# Patient Record
Sex: Female | Born: 1955 | Race: Black or African American | Hispanic: No | Marital: Single | Smoking: Current every day smoker
Health system: Southern US, Community
[De-identification: ages and names within clinical notes are randomized; demographics above are authoritative.]

## PROBLEM LIST (undated history)

## (undated) DIAGNOSIS — F419 Anxiety disorder, unspecified: Secondary | ICD-10-CM

## (undated) DIAGNOSIS — R7611 Nonspecific reaction to tuberculin skin test without active tuberculosis: Secondary | ICD-10-CM

## (undated) DIAGNOSIS — E785 Hyperlipidemia, unspecified: Secondary | ICD-10-CM

## (undated) DIAGNOSIS — I10 Essential (primary) hypertension: Secondary | ICD-10-CM

## (undated) DIAGNOSIS — F32A Depression, unspecified: Secondary | ICD-10-CM

## (undated) DIAGNOSIS — F329 Major depressive disorder, single episode, unspecified: Secondary | ICD-10-CM

## (undated) DIAGNOSIS — E119 Type 2 diabetes mellitus without complications: Secondary | ICD-10-CM

## (undated) HISTORY — DX: Type 2 diabetes mellitus without complications: E11.9

## (undated) HISTORY — DX: Hyperlipidemia, unspecified: E78.5

## (undated) HISTORY — DX: Anxiety disorder, unspecified: F41.9

## (undated) HISTORY — DX: Major depressive disorder, single episode, unspecified: F32.9

## (undated) HISTORY — DX: Essential (primary) hypertension: I10

## (undated) HISTORY — DX: Nonspecific reaction to tuberculin skin test without active tuberculosis: R76.11

## (undated) HISTORY — PX: COLONOSCOPY: SHX174

## (undated) HISTORY — DX: Depression, unspecified: F32.A

---

## 1982-01-14 HISTORY — PX: TUBAL LIGATION: SHX77

## 1999-04-20 ENCOUNTER — Other Ambulatory Visit: Admission: RE | Admit: 1999-04-20 | Discharge: 1999-04-20 | Payer: Self-pay | Admitting: Family Medicine

## 2001-06-29 ENCOUNTER — Ambulatory Visit (HOSPITAL_COMMUNITY): Admission: RE | Admit: 2001-06-29 | Discharge: 2001-06-29 | Payer: Self-pay | Admitting: Family Medicine

## 2001-06-29 ENCOUNTER — Encounter: Payer: Self-pay | Admitting: Family Medicine

## 2003-07-08 ENCOUNTER — Other Ambulatory Visit: Admission: RE | Admit: 2003-07-08 | Discharge: 2003-07-08 | Payer: Self-pay | Admitting: Family Medicine

## 2004-01-15 DIAGNOSIS — I10 Essential (primary) hypertension: Secondary | ICD-10-CM

## 2004-01-15 HISTORY — DX: Essential (primary) hypertension: I10

## 2004-05-14 ENCOUNTER — Ambulatory Visit (HOSPITAL_COMMUNITY): Admission: RE | Admit: 2004-05-14 | Discharge: 2004-05-14 | Payer: Self-pay | Admitting: Family Medicine

## 2005-05-09 ENCOUNTER — Other Ambulatory Visit: Admission: RE | Admit: 2005-05-09 | Discharge: 2005-05-09 | Payer: Self-pay | Admitting: Family Medicine

## 2005-11-05 ENCOUNTER — Ambulatory Visit (HOSPITAL_COMMUNITY): Admission: RE | Admit: 2005-11-05 | Discharge: 2005-11-05 | Payer: Self-pay | Admitting: Family Medicine

## 2006-05-14 ENCOUNTER — Other Ambulatory Visit: Admission: RE | Admit: 2006-05-14 | Discharge: 2006-05-14 | Payer: Self-pay | Admitting: Family Medicine

## 2007-07-02 ENCOUNTER — Other Ambulatory Visit: Admission: RE | Admit: 2007-07-02 | Discharge: 2007-07-02 | Payer: Self-pay | Admitting: Family Medicine

## 2007-08-19 ENCOUNTER — Encounter: Admission: RE | Admit: 2007-08-19 | Discharge: 2007-08-19 | Payer: Self-pay | Admitting: Family Medicine

## 2007-11-15 ENCOUNTER — Emergency Department (HOSPITAL_COMMUNITY): Admission: EM | Admit: 2007-11-15 | Discharge: 2007-11-15 | Payer: Self-pay | Admitting: Emergency Medicine

## 2008-01-15 HISTORY — PX: KNEE SURGERY: SHX244

## 2009-05-19 ENCOUNTER — Encounter: Admission: RE | Admit: 2009-05-19 | Discharge: 2009-05-19 | Payer: Self-pay | Admitting: Family Medicine

## 2010-01-14 DIAGNOSIS — E785 Hyperlipidemia, unspecified: Secondary | ICD-10-CM

## 2010-01-14 HISTORY — DX: Hyperlipidemia, unspecified: E78.5

## 2010-02-04 ENCOUNTER — Encounter: Payer: Self-pay | Admitting: Family Medicine

## 2010-02-16 ENCOUNTER — Emergency Department (HOSPITAL_COMMUNITY)
Admission: EM | Admit: 2010-02-16 | Discharge: 2010-02-16 | Disposition: A | Discharge status: Home / Self Care | Payer: Self-pay | Attending: Emergency Medicine | Admitting: Emergency Medicine

## 2010-02-16 DIAGNOSIS — R5381 Other malaise: Secondary | ICD-10-CM | POA: Insufficient documentation

## 2010-02-16 DIAGNOSIS — R5383 Other fatigue: Secondary | ICD-10-CM | POA: Insufficient documentation

## 2010-02-16 DIAGNOSIS — I10 Essential (primary) hypertension: Secondary | ICD-10-CM | POA: Insufficient documentation

## 2010-02-16 DIAGNOSIS — R002 Palpitations: Secondary | ICD-10-CM | POA: Insufficient documentation

## 2010-02-16 DIAGNOSIS — Z79899 Other long term (current) drug therapy: Secondary | ICD-10-CM | POA: Insufficient documentation

## 2010-02-16 DIAGNOSIS — R209 Unspecified disturbances of skin sensation: Secondary | ICD-10-CM | POA: Insufficient documentation

## 2010-02-16 DIAGNOSIS — R42 Dizziness and giddiness: Secondary | ICD-10-CM | POA: Insufficient documentation

## 2010-02-16 DIAGNOSIS — F411 Generalized anxiety disorder: Secondary | ICD-10-CM | POA: Insufficient documentation

## 2010-02-16 LAB — POCT I-STAT, CHEM 8
BUN: 15 mg/dL (ref 6–23)
Creatinine, Ser: 1 mg/dL (ref 0.4–1.2)
Hemoglobin: 14.3 g/dL (ref 12.0–15.0)
Potassium: 3.8 mEq/L (ref 3.5–5.1)
Sodium: 143 mEq/L (ref 135–145)
TCO2: 27 mmol/L (ref 0–100)

## 2011-01-14 IMAGING — CR DG CHEST 2V
2 series · 2 of 2 positions shown · non-contrast
Comparison: None.

CLINICAL DATA: Cough, positive PPD

CHEST - 2 VIEW

[view not recorded (1 of 2)]
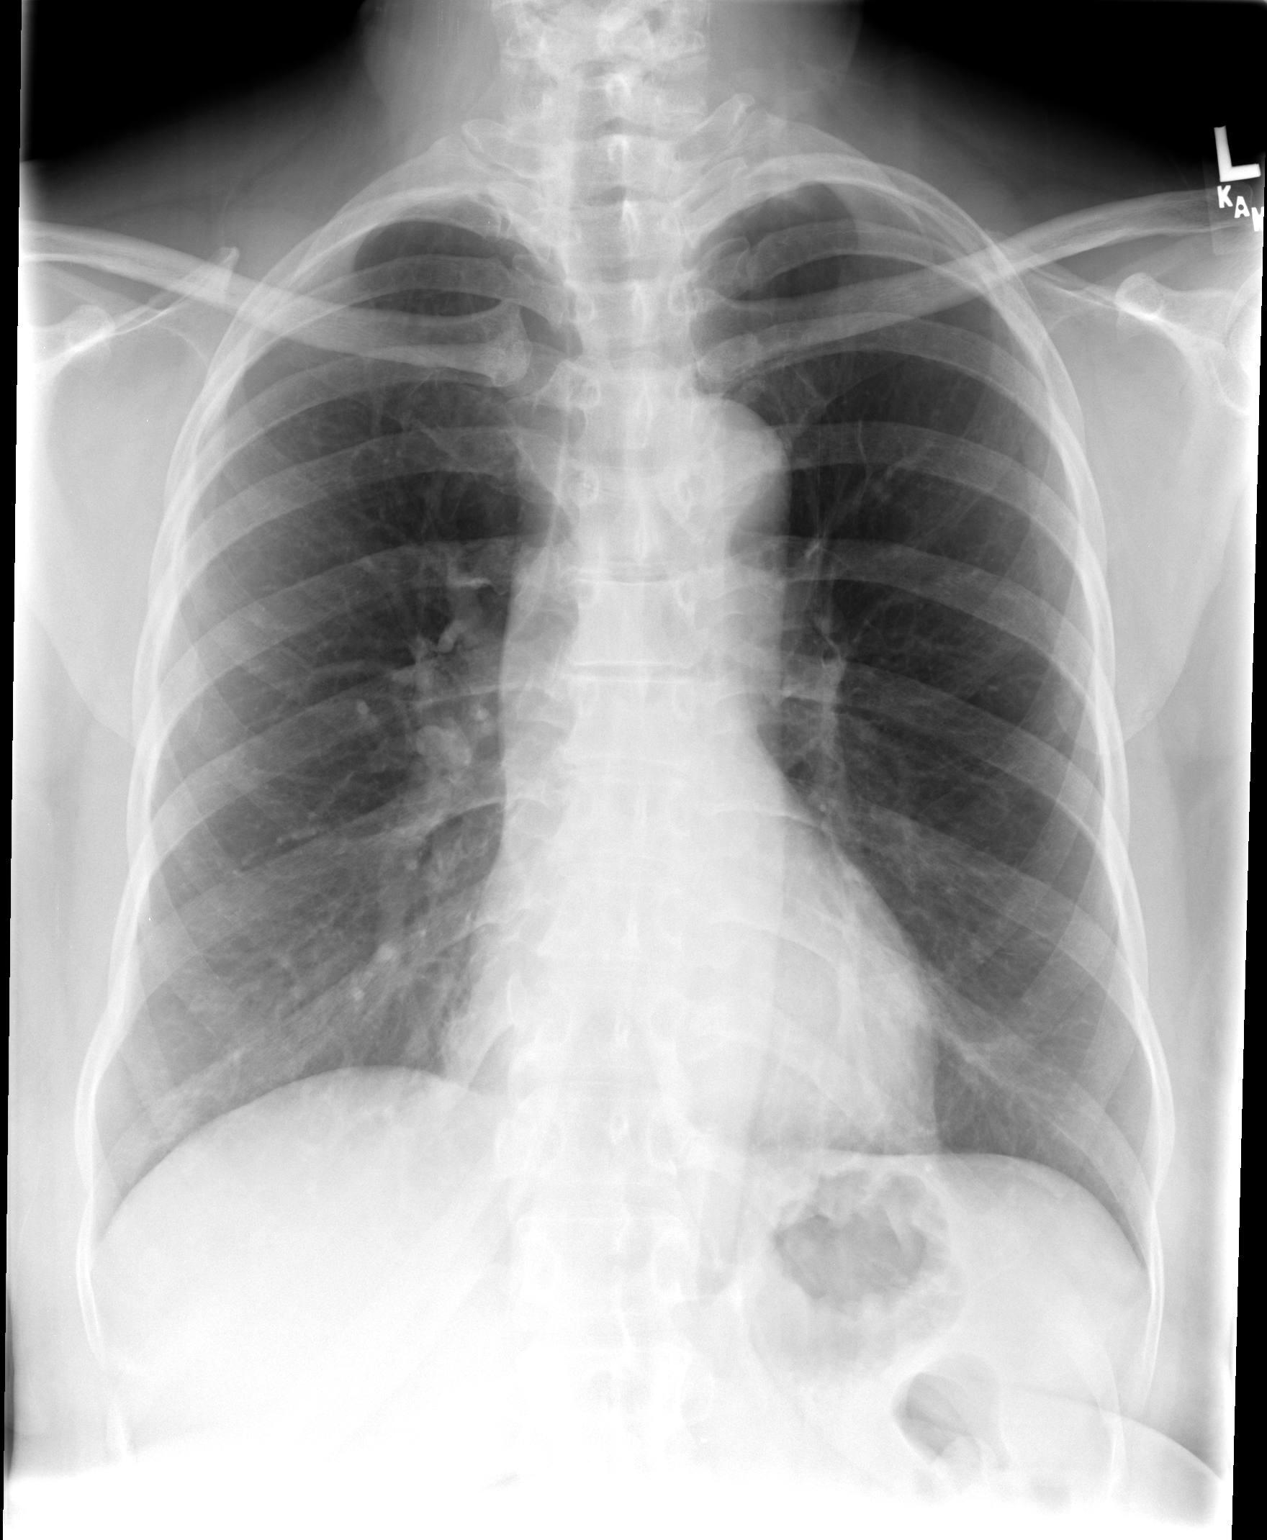

[view not recorded (2 of 2)]
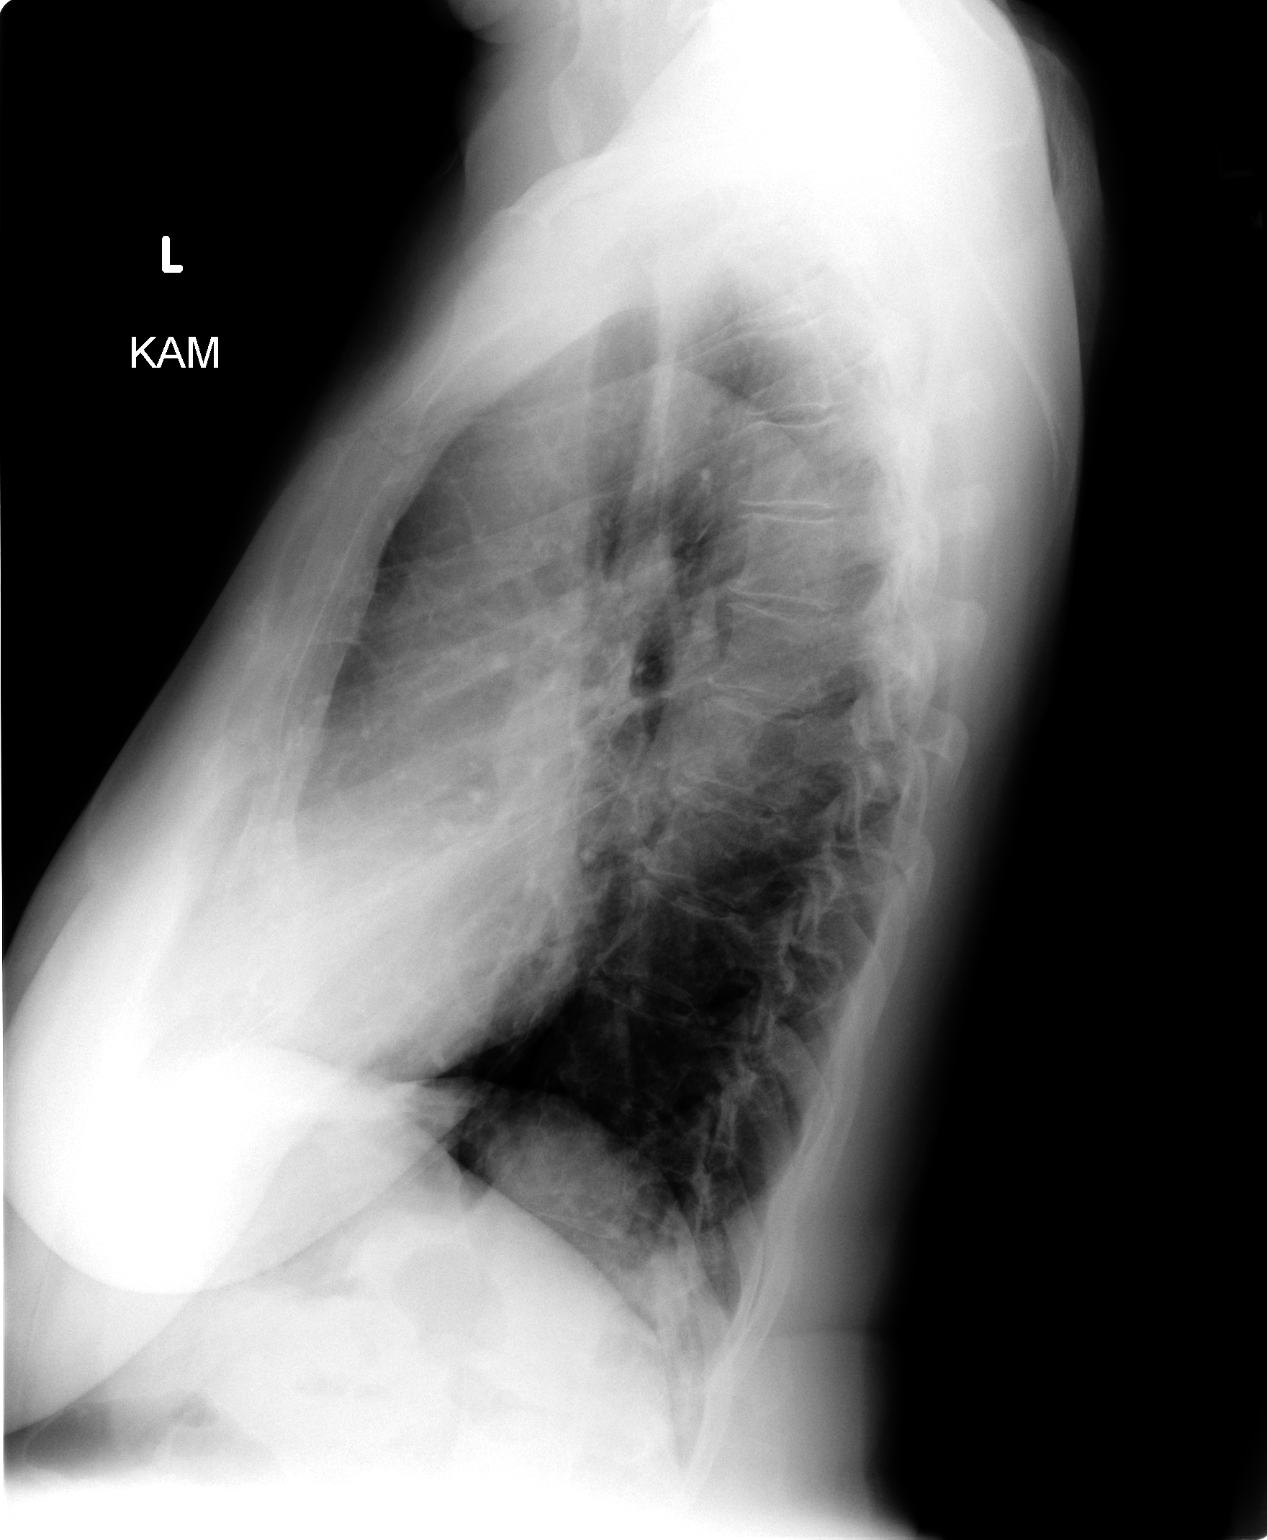

[2 of 2 positions shown; findings below may reference images not displayed]

FINDINGS: Lungs clear.  Heart size and pulmonary vascularity
normal.  No effusion.  Visualized bones unremarkable.
IMPRESSION: No acute disease

## 2015-01-13 ENCOUNTER — Encounter: Payer: Self-pay | Admitting: Family Medicine

## 2015-01-13 ENCOUNTER — Ambulatory Visit: Payer: Self-pay | Attending: Family Medicine | Admitting: Family Medicine

## 2015-01-13 VITALS — BP 131/81 | HR 85 | Temp 99.1°F | Resp 18 | Ht 65.0 in | Wt 185.0 lb

## 2015-01-13 DIAGNOSIS — I10 Essential (primary) hypertension: Secondary | ICD-10-CM | POA: Insufficient documentation

## 2015-01-13 DIAGNOSIS — Z833 Family history of diabetes mellitus: Secondary | ICD-10-CM | POA: Insufficient documentation

## 2015-01-13 DIAGNOSIS — E785 Hyperlipidemia, unspecified: Secondary | ICD-10-CM | POA: Insufficient documentation

## 2015-01-13 DIAGNOSIS — R2230 Localized swelling, mass and lump, unspecified upper limb: Secondary | ICD-10-CM | POA: Insufficient documentation

## 2015-01-13 DIAGNOSIS — Z114 Encounter for screening for human immunodeficiency virus [HIV]: Secondary | ICD-10-CM | POA: Insufficient documentation

## 2015-01-13 DIAGNOSIS — Z0000 Encounter for general adult medical examination without abnormal findings: Secondary | ICD-10-CM | POA: Insufficient documentation

## 2015-01-13 DIAGNOSIS — E559 Vitamin D deficiency, unspecified: Secondary | ICD-10-CM | POA: Insufficient documentation

## 2015-01-13 DIAGNOSIS — R2232 Localized swelling, mass and lump, left upper limb: Secondary | ICD-10-CM | POA: Insufficient documentation

## 2015-01-13 DIAGNOSIS — E119 Type 2 diabetes mellitus without complications: Secondary | ICD-10-CM | POA: Insufficient documentation

## 2015-01-13 DIAGNOSIS — D171 Benign lipomatous neoplasm of skin and subcutaneous tissue of trunk: Secondary | ICD-10-CM | POA: Insufficient documentation

## 2015-01-13 DIAGNOSIS — Z23 Encounter for immunization: Secondary | ICD-10-CM | POA: Insufficient documentation

## 2015-01-13 DIAGNOSIS — Z1159 Encounter for screening for other viral diseases: Secondary | ICD-10-CM | POA: Insufficient documentation

## 2015-01-13 DIAGNOSIS — E1169 Type 2 diabetes mellitus with other specified complication: Secondary | ICD-10-CM | POA: Insufficient documentation

## 2015-01-13 DIAGNOSIS — F17200 Nicotine dependence, unspecified, uncomplicated: Secondary | ICD-10-CM | POA: Insufficient documentation

## 2015-01-13 LAB — COMPLETE METABOLIC PANEL WITH GFR
ALT: 25 U/L (ref 6–29)
AST: 18 U/L (ref 10–35)
Albumin: 4.3 g/dL (ref 3.6–5.1)
Alkaline Phosphatase: 93 U/L (ref 33–130)
BUN: 10 mg/dL (ref 7–25)
CALCIUM: 9.5 mg/dL (ref 8.6–10.4)
CHLORIDE: 105 mmol/L (ref 98–110)
CO2: 27 mmol/L (ref 20–31)
CREATININE: 0.88 mg/dL (ref 0.50–1.05)
GFR, Est African American: 84 mL/min (ref >=60)
GFR, Est Non African American: 73 mL/min (ref >=60)
GLUCOSE: 100 mg/dL — AB (ref 65–99)
Potassium: 4.3 mmol/L (ref 3.5–5.3)
SODIUM: 141 mmol/L (ref 135–146)
Total Bilirubin: 0.4 mg/dL (ref 0.2–1.2)
Total Protein: 7.6 g/dL (ref 6.1–8.1)

## 2015-01-13 LAB — LIPID PANEL
Cholesterol: 171 mg/dL (ref 125–200)
HDL: 37 mg/dL — ABNORMAL LOW (ref >=46)
LDL CALC: 95 mg/dL (ref <130)
Total CHOL/HDL Ratio: 4.6 Ratio (ref <=5.0)
Triglycerides: 196 mg/dL — ABNORMAL HIGH (ref <150)
VLDL: 39 mg/dL — AB (ref <30)

## 2015-01-13 LAB — GLUCOSE, POCT (MANUAL RESULT ENTRY): POC Glucose: 119 mg/dl — AB (ref 70–99)

## 2015-01-13 LAB — POCT GLYCOSYLATED HEMOGLOBIN (HGB A1C): HEMOGLOBIN A1C: 7.1

## 2015-01-13 MED ORDER — GLUCOSE BLOOD VI STRP: 1.0000 | ORAL_STRIP | Freq: Three times a day (TID) | Status: DC

## 2015-01-13 MED ORDER — ATORVASTATIN CALCIUM 10 MG PO TABS: 10.0000 mg | ORAL_TABLET | Freq: Every day | ORAL | Status: DC

## 2015-01-13 MED ORDER — TRUE METRIX METER W/DEVICE KIT: 1.0000 | PACK | Status: DC | PRN

## 2015-01-13 MED ORDER — TRUEPLUS LANCETS 28G MISC: 1.0000 | Freq: Three times a day (TID) | Status: DC

## 2015-01-13 MED ORDER — VITAMIN D 50 MCG (2000 UT) PO TABS: 2000.0000 [IU] | ORAL_TABLET | Freq: Every day | ORAL | Status: DC

## 2015-01-13 MED ORDER — LISINOPRIL-HYDROCHLOROTHIAZIDE 20-12.5 MG PO TABS: 1.0000 | ORAL_TABLET | Freq: Every day | ORAL | Status: DC

## 2015-01-13 NOTE — Assessment & Plan Note (Signed)
Painless, chronic  Patient uninsured Reassurance

## 2015-01-13 NOTE — Progress Notes (Addendum)
Subjective:  Patient ID: Tammy Beck, female    DOB: [birth year]  Age: 59 y.o. MRN: [id number]  CC: Establish Care   HPI Tammy Beck presents for    1. HTN: has chronic HTN. No CP, HA, SOB or swelling. On prinzide.   2. HLD: taking lipitor. No myalgias.   3. Diabetes: diagnosed today. Patient with strong fam hx of diabetes and suspected diabetes due to tingling in  R arms and L finger tips. No vision changes. No polyuria or polydipsia. No tingling or numbness in feet.    4. L axilla mass: x one month. Non tender. No nipple discharge. She has hx of L upper back lipoma for manye years. Non tender.   Past Medical History  Diagnosis Date  . Diabetes mellitus without complication ([place]) 78/29/5621  . Hypertension 2006   . Hyperlipidemia 2012    Past Surgical History  Procedure Laterality Date  . Knee surgery Right 2010    torn meniscus   . Tubal ligation  1984    Family History  Problem Relation Age of Onset  . Diabetes Brother   . Colon cancer Brother 59  . Kidney disease Brother   . Diabetes Mother   . Kidney disease Mother   . Hypertension Mother   . Kidney disease Son     Social History  Substance Use Topics  . Smoking status: Current Every Day Smoker  . Smokeless tobacco: Never Used  . Alcohol Use: 0.0 oz/week    0 Standard drinks or equivalent per week     Comment: occ     ROS Review of Systems  Constitutional: Negative for fever and chills.  Eyes: Negative for visual disturbance.  Respiratory: Negative for shortness of breath.   Cardiovascular: Negative for chest pain.  Gastrointestinal: Negative for abdominal pain and blood in stool.  Musculoskeletal: Negative for back pain and arthralgias.  Skin: Negative for rash.  Allergic/Immunologic: Negative for immunocompromised state.  Neurological: Positive for numbness (R hand and arm intetmittent with tingling ).  Hematological: Negative for adenopathy. Does not bruise/bleed easily.    Psychiatric/Behavioral: Negative for suicidal ideas and dysphoric mood.    Objective:   Today's Vitals: BP 131/81 mmHg  Pulse 85  Temp(Src) 99.1 F (37.3 C) (Oral)  Resp 18  Ht '5\' 5"'  (1.651 m)  Wt 185 lb (83.915 kg)  BMI 30.79 kg/m2  SpO2 98%  Physical Exam  Constitutional: She is oriented to person, place, and time. She appears well-developed and well-nourished. No distress.  HENT:  Head: Normocephalic and atraumatic.  Cardiovascular: Normal rate, regular rhythm, normal heart sounds and intact distal pulses.   Pulmonary/Chest: Effort normal and breath sounds normal. Right breast exhibits no inverted nipple, no mass, no nipple discharge, no skin change and no tenderness. Left breast exhibits mass. Left breast exhibits no inverted nipple, no nipple discharge, no skin change and no tenderness. Breasts are symmetrical.    Musculoskeletal: She exhibits no edema.       Arms: Neurological: She is alert and oriented to person, place, and time.  Skin: Skin is warm and dry. No rash noted.  Psychiatric: She has a normal mood and affect.   Lab Results  Component Value Date   HGBA1C 7.10 01/13/2015   CBG 118   Depression screen PHQ 2/9 01/13/2015  Decreased Interest 0  Down, Depressed, Hopeless 0  PHQ - 2 Score 0     Assessment & Plan:  Kerie was seen today for establish care.  Diagnoses and  all orders for this visit:  Essential hypertension -     COMPLETE METABOLIC PANEL WITH GFR -     lisinopril-hydrochlorothiazide (PRINZIDE,ZESTORETIC) 20-12.5 MG tablet; Take 1 tablet by mouth daily.  Hyperlipidemia -     Lipid Panel -     atorvastatin (LIPITOR) 10 MG tablet; Take 1 tablet (10 mg total) by mouth daily.  Vitamin D deficiency -     Vitamin D, 25-hydroxy -     Cholecalciferol (VITAMIN D) 2000 units tablet; Take 1 tablet (2,000 Units total) by mouth daily.  Family history of diabetes mellitus (DM) -     POCT A1C -     Glucose (CBG)  Controlled type 2 diabetes  mellitus without complication, without long-term current use of insulin (HCC) -     COMPLETE METABOLIC PANEL WITH GFR -     Microalbumin/Creatinine Ratio, Urine -     Blood Glucose Monitoring Suppl (TRUE METRIX METER) w/Device KIT; 1 each by Does not apply route as needed. -     TRUEPLUS LANCETS 28G MISC; 1 each by Does not apply route 3 (three) times daily. -     glucose blood (TRUE METRIX BLOOD GLUCOSE TEST) test strip; 1 each by Other route 3 (three) times daily.  Lipoma of back  Mass of axilla, left -     MM Digital Diagnostic Bilat; Future -     US BREAST COMPLETE UNI LEFT INC AXILLA; Future  Healthcare maintenance -     Flu Vaccine QUAD 36+ mos PF IM (Fluarix & Fluzone Quad PF)  Need for hepatitis C screening test -     Hepatitis C antibody, reflex  Screening for HIV (human immunodeficiency virus) -     HIV antibody (with reflex)    Outpatient Encounter Prescriptions as of 01/13/2015  Medication Sig  . atorvastatin (LIPITOR) 10 MG tablet Take 1 tablet (10 mg total) by mouth daily.  . Cholecalciferol (VITAMIN D) 2000 units tablet Take 1 tablet (2,000 Units total) by mouth daily.  Marland Kitchen lisinopril-hydrochlorothiazide (PRINZIDE,ZESTORETIC) 20-12.5 MG tablet Take 1 tablet by mouth daily.  . [DISCONTINUED] atorvastatin (LIPITOR) 10 MG tablet Take 10 mg by mouth daily.  . [DISCONTINUED] Cholecalciferol (VITAMIN D) 2000 units tablet Take 2,000 Units by mouth daily.  . [DISCONTINUED] lisinopril-hydrochlorothiazide (PRINZIDE,ZESTORETIC) 20-12.5 MG tablet Take 1 tablet by mouth daily.  . Blood Glucose Monitoring Suppl (TRUE METRIX METER) w/Device KIT 1 each by Does not apply route as needed.  Marland Kitchen glucose blood (TRUE METRIX BLOOD GLUCOSE TEST) test strip 1 each by Other route 3 (three) times daily.  . TRUEPLUS LANCETS 28G MISC 1 each by Does not apply route 3 (three) times daily.   No facility-administered encounter medications on file as of 01/13/2015.    Follow-up: No Follow-up on file.     Boykin Nearing MD

## 2015-01-13 NOTE — Assessment & Plan Note (Signed)
A: controlled P; CMP Refilled prinzide

## 2015-01-13 NOTE — Assessment & Plan Note (Signed)
A: suspect lipoma, new mass P: Diagnostic mammogram and [place] ordered Patient to meet with financial for orange card/[place] discount

## 2015-01-13 NOTE — Progress Notes (Signed)
Patient here to Establish Care.  Patient complains of tingling in her right hand and arm. Patient states feeling has been present "for awhile"  Patient also would like fatty tissue assessed on her left back shoulder/arm pit.  Patient tolerated flu shot well.

## 2015-01-13 NOTE — Assessment & Plan Note (Signed)
A: new diagnosis. Strong fam hx P: Low carb diet Meter for testing  [facility] per orders

## 2015-01-13 NOTE — Assessment & Plan Note (Signed)
Checking lipids

## 2015-01-13 NOTE — Patient Instructions (Addendum)
Tammy Beck was seen today for establish care.  Diagnoses and all orders for this visit:  Essential hypertension -     COMPLETE METABOLIC PANEL WITH GFR -     lisinopril-hydrochlorothiazide (PRINZIDE,ZESTORETIC) 20-12.5 MG tablet; Take 1 tablet by mouth daily.  Hyperlipidemia -     Lipid Panel -     atorvastatin (LIPITOR) 10 MG tablet; Take 1 tablet (10 mg total) by mouth daily.  Vitamin D deficiency -     Vitamin D, 25-hydroxy -     Cholecalciferol (VITAMIN D) 2000 units tablet; Take 1 tablet (2,000 Units total) by mouth daily.  Family history of diabetes mellitus (DM) -     POCT A1C -     Glucose (CBG)  Controlled type 2 diabetes mellitus without complication, without long-term current use of insulin (HCC) -     COMPLETE METABOLIC PANEL WITH GFR -     Microalbumin/Creatinine Ratio, Urine -     Blood Glucose Monitoring Suppl (TRUE METRIX METER) w/Device KIT; 1 each by Does not apply route as needed. -     TRUEPLUS LANCETS 28G MISC; 1 each by Does not apply route 3 (three) times daily. -     glucose blood (TRUE METRIX BLOOD GLUCOSE TEST) test strip; 1 each by Other route 3 (three) times daily.  Lipoma of back  Mass of axilla, left -     MM Digital Diagnostic Bilat; Future -     US BREAST COMPLETE UNI LEFT INC AXILLA; Future   In order to have diagnostic mammogram and US done some coverage in needed. Please schedule appointment with financial advisors.  Please apply for [place] discount and orange card, you can also inquire if any of your medications are on the PASS (medications assistance) list.    You can also call this number to inquire about the BCCCP program  Please call Rolena Infante, [phone],  with the BCCCP (breast and cervical cancer control program) at the [facility] to set up an appointment to verify eligibility for a breast exam, mammogram, ultrasound. If you qualify this will be set up at [facility].   Smoking cessation support: smoking cessation  hotline: 1-800-QUIT-NOW.  Smoking cessation classes are available through [facility] and [facility]. Call [phone] or visit our website at [email or web address]   Diabetes blood sugar goals  Fasting (in AM before breakfast, 8 hrs of no eating or drinking (except water or unsweetened coffee or tea): 90-110 2 hrs after meals: < 160,   No low sugars: nothing < 70   F/u in 6-8 weeks for pap  Dr. Adrian Blackwater

## 2015-01-13 NOTE — Assessment & Plan Note (Signed)
Check vit D 

## 2015-01-14 LAB — MICROALBUMIN / CREATININE URINE RATIO
CREATININE, URINE: 103 mg/dL (ref 20–320)
MICROALB UR: 0.4 mg/dL
MICROALB/CREAT RATIO: 4 ug/mg{creat} (ref <30)

## 2015-01-14 LAB — HEPATITIS C ANTIBODY: HCV Ab: NEGATIVE

## 2015-01-14 LAB — HIV ANTIBODY (ROUTINE TESTING W REFLEX): HIV 1&2 Ab, 4th Generation: NONREACTIVE

## 2015-01-14 LAB — VITAMIN D 25 HYDROXY (VIT D DEFICIENCY, FRACTURES): Vit D, 25-Hydroxy: 15 ng/mL — ABNORMAL LOW (ref 30–100)

## 2015-01-17 MED ORDER — ATORVASTATIN CALCIUM 40 MG PO TABS: 40.0000 mg | ORAL_TABLET | Freq: Every day | ORAL | Status: DC

## 2015-01-17 MED ORDER — VITAMIN D (ERGOCALCIFEROL) 1.25 MG (50000 UNIT) PO CAPS: 50000.0000 [IU] | ORAL_CAPSULE | ORAL | Status: DC

## 2015-01-17 MED FILL — ?ATORVASTATIN 40MG TABLET: 40 | 30 days supply | Qty: 30 | Fill #0

## 2015-01-17 MED FILL — VIT D2 1.25 MG (50,000 UNIT: 1.25 MG | 56 days supply | Qty: 8 | Fill #0

## 2015-01-17 NOTE — Addendum Note (Signed)
Addended by: Boykin Nearing on: 01/17/2015 12:38 PM   Modules accepted: Orders

## 2015-01-18 ENCOUNTER — Telehealth: Payer: Self-pay | Admitting: *Deleted

## 2015-01-18 NOTE — Telephone Encounter (Signed)
LVM to return call.

## 2015-01-18 NOTE — Telephone Encounter (Signed)
-----   Message from Boykin Nearing, MD sent at 01/17/2015 12:35 PM EST ----- Vit D deficiency replacing Elevated TGs on lipitor 10, increase to lipitor 40 mg, low sugar and low carb diet  All other labs normal

## 2015-01-18 NOTE — Telephone Encounter (Signed)
Pt. Returned call. Please f/u with pt. °

## 2015-01-18 NOTE — Telephone Encounter (Signed)
Date of birth verified by pt  Lab results given  MeRx send to [place]  Pt verbalized understanding

## 2015-02-06 ENCOUNTER — Other Ambulatory Visit (HOSPITAL_COMMUNITY): Payer: Self-pay | Admitting: *Deleted

## 2015-02-06 DIAGNOSIS — R2231 Localized swelling, mass and lump, right upper limb: Secondary | ICD-10-CM

## 2015-02-06 DIAGNOSIS — R2232 Localized swelling, mass and lump, left upper limb: Secondary | ICD-10-CM

## 2015-02-08 ENCOUNTER — Telehealth (HOSPITAL_COMMUNITY): Payer: Self-pay | Admitting: *Deleted

## 2015-02-08 NOTE — Telephone Encounter (Signed)
Telephoned patient at home # and confirmed appointment [place] appointment

## 2015-02-09 ENCOUNTER — Ambulatory Visit
Admission: RE | Admit: 2015-02-09 | Discharge: 2015-02-09 | Disposition: A | Discharge status: Home / Self Care | Payer: No Typology Code available for payment source | Source: Ambulatory Visit | Attending: Obstetrics and Gynecology | Admitting: Obstetrics and Gynecology

## 2015-02-09 ENCOUNTER — Other Ambulatory Visit (HOSPITAL_COMMUNITY): Payer: Self-pay | Admitting: Obstetrics and Gynecology

## 2015-02-09 ENCOUNTER — Ambulatory Visit (HOSPITAL_COMMUNITY)
Admission: RE | Admit: 2015-02-09 | Discharge: 2015-02-09 | Disposition: A | Discharge status: Home / Self Care | Payer: Self-pay | Source: Ambulatory Visit | Attending: Obstetrics and Gynecology | Admitting: Obstetrics and Gynecology

## 2015-02-09 ENCOUNTER — Encounter (HOSPITAL_COMMUNITY): Payer: Self-pay

## 2015-02-09 VITALS — BP 132/86 | Temp 98.6°F | Ht 65.0 in | Wt 176.0 lb

## 2015-02-09 DIAGNOSIS — R2232 Localized swelling, mass and lump, left upper limb: Secondary | ICD-10-CM

## 2015-02-09 DIAGNOSIS — R2231 Localized swelling, mass and lump, right upper limb: Secondary | ICD-10-CM

## 2015-02-09 DIAGNOSIS — Z1239 Encounter for other screening for malignant neoplasm of breast: Secondary | ICD-10-CM

## 2015-02-09 NOTE — Progress Notes (Signed)
Complaints of left axillary lump x several months.  Pap Smear:  Pap smear not completed today. Last Pap smear was in 2015 in [place] and normal per patient. Per patient has no history of an abnormal Pap smear. No Pap smear results are in EPIC.  Physical exam: Breasts Breasts symmetrical. No skin abnormalities bilateral breasts. No nipple retraction bilateral breasts. No nipple discharge bilateral breasts. No lymphadenopathy. Palpated a lump within the left axilla at 1 o'clock 22 cm from the nipple. Palpated a lump within the right axilla at 1 o'clock 21 cm from the nipple. No complaints of pain or tenderness on exam. Referred patient to the [place] for bilateral diagnostic mammogram and possible bilateral breast ultrasounds. Appointment scheduled for Thursday, February 09, 2015 at 1110.    Pelvic/Bimanual No Pap smear completed today since last Pap smear was in 2015 per patient. Pap smear not indicated per BCCCP guidelines.   Smoking History: Smoking cessation discussed with patient. Referred patient to the [facility] Quitline and gave resources to free classes offered at the [facility].  Patient Navigation: Patient education provided. Access to services provided for patient through [facility] program.   Colorectal Cancer Screening: Patient had a colonoscopy completed in 2013. No complaints today.

## 2015-02-09 NOTE — Patient Instructions (Signed)
Educational materials on self breast awareness given. Explained to Tammy Beck that she did not need a Pap smear today due to last Pap smear was in 2015 per patient. Let her know BCCCP will cover Pap smears every 3 years unless has a history of abnormal Pap smears. Referred patient to the [place] for bilateral diagnostic mammogram and possible bilateral breast ultrasounds. Appointment scheduled for Thursday, February 09, 2015 at 1110. Patient aware of appointment and will be there. Smoking cessation discussed with patient. Referred patient to the [facility] Quitline and gave resources to free classes offered at the [facility]. Tammy Beck verbalized understanding.  Tammy Beck, Arvil Chaco, RN 10:29 AM

## 2015-02-13 ENCOUNTER — Encounter (HOSPITAL_COMMUNITY): Payer: Self-pay | Admitting: *Deleted

## 2015-02-14 MED FILL — ?ATORVASTATIN 40MG TABLET: 40 | 30 days supply | Qty: 30 | Fill #1

## 2015-02-14 MED FILL — LISINOPRIL-HCTZ 20-12.5 MG: 20-12.5 | 30 days supply | Qty: 30 | Fill #1

## 2015-02-15 ENCOUNTER — Ambulatory Visit: Payer: Self-pay

## 2015-03-17 MED FILL — ?ATORVASTATIN 40MG TABLET: 40 | 30 days supply | Qty: 30 | Fill #2

## 2015-03-17 MED FILL — LISINOPRIL-HCTZ 20-12.5 MG: 20-12.5 | 30 days supply | Qty: 30 | Fill #2

## 2015-03-28 ENCOUNTER — Ambulatory Visit: Payer: No Typology Code available for payment source | Attending: Family Medicine

## 2015-03-31 ENCOUNTER — Encounter: Payer: Self-pay | Admitting: Family Medicine

## 2015-03-31 ENCOUNTER — Ambulatory Visit: Payer: Self-pay | Attending: Family Medicine | Admitting: Family Medicine

## 2015-03-31 VITALS — BP 116/77 | HR 90 | Temp 98.8°F | Resp 16 | Ht 65.0 in | Wt 168.0 lb

## 2015-03-31 DIAGNOSIS — Z0000 Encounter for general adult medical examination without abnormal findings: Secondary | ICD-10-CM

## 2015-03-31 DIAGNOSIS — J989 Respiratory disorder, unspecified: Secondary | ICD-10-CM

## 2015-03-31 DIAGNOSIS — Z79899 Other long term (current) drug therapy: Secondary | ICD-10-CM | POA: Insufficient documentation

## 2015-03-31 DIAGNOSIS — I10 Essential (primary) hypertension: Secondary | ICD-10-CM | POA: Insufficient documentation

## 2015-03-31 DIAGNOSIS — R222 Localized swelling, mass and lump, trunk: Secondary | ICD-10-CM

## 2015-03-31 DIAGNOSIS — R490 Dysphonia: Secondary | ICD-10-CM | POA: Insufficient documentation

## 2015-03-31 DIAGNOSIS — E119 Type 2 diabetes mellitus without complications: Secondary | ICD-10-CM | POA: Insufficient documentation

## 2015-03-31 DIAGNOSIS — Z87891 Personal history of nicotine dependence: Secondary | ICD-10-CM | POA: Insufficient documentation

## 2015-03-31 LAB — POCT GLYCOSYLATED HEMOGLOBIN (HGB A1C): Hemoglobin A1C: 6.2

## 2015-03-31 LAB — GLUCOSE, POCT (MANUAL RESULT ENTRY): POC Glucose: 122 mg/dl — AB (ref 70–99)

## 2015-03-31 MED ORDER — PANTOPRAZOLE SODIUM 40 MG PO TBEC: 40.0000 mg | DELAYED_RELEASE_TABLET | Freq: Every day | ORAL | Status: DC

## 2015-03-31 MED FILL — PANTOPRAZOLE SOD DR 40 MG T: 40 | 30 days supply | Qty: 30 | Fill #0

## 2015-03-31 NOTE — Progress Notes (Signed)
C/C lump on neck  Voice changes since January  No tobacco user-x 2 weeks  No pain today  No suicidal thoughts in the past two weeks

## 2015-03-31 NOTE — Patient Instructions (Addendum)
Diagnoses and all orders for this visit:  Controlled type 2 diabetes mellitus without complication, without long-term current use of insulin (HCC) -     POCT glycosylated hemoglobin (Hb A1C) -     POCT glucose (manual entry)  Respiratory illness -     DG Chest 2 View; Future  Hoarseness of voice -     pantoprazole (PROTONIX) 40 MG tablet; Take 1 tablet (40 mg total) by mouth daily.   You will be contacted with CXR results   F/u in 3-4 weeks   Dr. Adrian Blackwater

## 2015-03-31 NOTE — Progress Notes (Signed)
Subjective:  Patient ID: Tammy Beck, female    DOB: 1955-10-22  Age: 60 y.o. MRN: [id number]  CC: Hoarse   HPI Tammy Beck has HTN,diabetes , hx of smoking presents for   1.  Hoarse: x 2 weeks. Following URI. No sore throat, fever or chills. Hoarseness is slightly improved since onset of symptoms. She is a former smoker, quit two weeks ago. Denies heavy ETOH. Denies reflux.    2. Supraclavicular swelling: also noted two weeks ago. Painless. No cough, CP or SOB, no fever or chills. She has purposefully lost weight to better control her HTN and diabetes and feel well overall. Since first noticed, she reports enlargement is  a bit smaller.  She is a smoker. She has quit two weeks ago after smoking 1/2 PPD for many years. She has a mammogram done in 02/09/15 that was normal except for focal asymmetry in the R upper out breast felt to be fibroglandular tissue.    Social History  Substance Use Topics  . Smoking status: Current Every Day Smoker  . Smokeless tobacco: Never Used  . Alcohol Use: 0.0 oz/week    0 Standard drinks or equivalent per week     Comment: occ     Outpatient Prescriptions Prior to Visit  Medication Sig Dispense Refill  . atorvastatin (LIPITOR) 40 MG tablet Take 1 tablet (40 mg total) by mouth daily. 30 tablet 11  . Blood Glucose Monitoring Suppl (TRUE METRIX METER) w/Device KIT 1 each by Does not apply route as needed. 1 kit 0  . Cholecalciferol (VITAMIN D) 2000 units tablet Take 1 tablet (2,000 Units total) by mouth daily. 30 tablet 2  . glucose blood (TRUE METRIX BLOOD GLUCOSE TEST) test strip 1 each by Other route 3 (three) times daily. 100 each 11  . lisinopril-hydrochlorothiazide (PRINZIDE,ZESTORETIC) 20-12.5 MG tablet Take 1 tablet by mouth daily. 30 tablet 2  . TRUEPLUS LANCETS 28G MISC 1 each by Does not apply route 3 (three) times daily. 100 each 11  . Vitamin D, Ergocalciferol, (DRISDOL) 50000 units CAPS capsule Take 1 capsule (50,000 Units total) by  mouth every 7 (seven) days. For 8 weeks 8 capsule 0   No facility-administered medications prior to visit.    ROS Review of Systems  Constitutional: Negative for fever and chills.  HENT: Positive for voice change.   Eyes: Negative for visual disturbance.  Respiratory: Negative for shortness of breath.   Cardiovascular: Negative for chest pain.  Gastrointestinal: Negative for abdominal pain and blood in stool.  Musculoskeletal: Negative for back pain and arthralgias.  Skin: Negative for rash.  Allergic/Immunologic: Negative for immunocompromised state.  Hematological: Negative for adenopathy. Does not bruise/bleed easily.  Psychiatric/Behavioral: Negative for suicidal ideas and dysphoric mood.    Objective:  BP 116/77 mmHg  Pulse 90  Temp(Src) 98.8 F (37.1 C) (Oral)  Resp 16  Ht '5\' 5"'  (1.651 m)  Wt 168 lb (76.204 kg)  BMI 27.96 kg/m2  SpO2 96%  LMP  (Exact Date)  BP/Weight 03/31/2015 02/09/2015 79/02/4095  Systolic BP 353 299 242  Diastolic BP 77 86 81  Wt. (Lbs) 168 176 185  BMI 27.96 29.29 30.79    Physical Exam  Constitutional: She is oriented to person, place, and time. She appears well-developed and well-nourished. No distress.  HENT:  Head: Normocephalic and atraumatic.  Neck: Normal range of motion. Neck supple. No tracheal deviation present. No thyromegaly present.    Cardiovascular: Normal rate, regular rhythm, normal heart sounds and intact distal  pulses.   Pulmonary/Chest: Effort normal and breath sounds normal. No stridor.  Musculoskeletal: She exhibits no edema.  Lymphadenopathy:    She has no cervical adenopathy.  Neurological: She is alert and oriented to person, place, and time.  Skin: Skin is warm and dry. No rash noted.  Psychiatric: She has a normal mood and affect.    Lab Results  Component Value Date   HGBA1C 7.10 01/13/2015   Lab Results  Component Value Date   HGBA1C 6.2 03/31/2015    CBG 122 Assessment & Plan:   Tammy Beck was seen  today for hoarse.  Diagnoses and all orders for this visit:  Controlled type 2 diabetes mellitus without complication, without long-term current use of insulin (HCC) -     POCT glycosylated hemoglobin (Hb A1C) -     POCT glucose (manual entry)  Respiratory illness -     DG Chest 2 View; Future  Hoarseness of voice -     pantoprazole (PROTONIX) 40 MG tablet; Take 1 tablet (40 mg total) by mouth daily.  Supraclavicular fossa fullness    No orders of the defined types were placed in this encounter.    Follow-up: No Follow-up on file.   Boykin Nearing MD

## 2015-04-02 ENCOUNTER — Encounter: Payer: Self-pay | Admitting: Family Medicine

## 2015-04-02 DIAGNOSIS — R222 Localized swelling, mass and lump, trunk: Secondary | ICD-10-CM | POA: Insufficient documentation

## 2015-04-02 DIAGNOSIS — R490 Dysphonia: Secondary | ICD-10-CM | POA: Insufficient documentation

## 2015-04-02 NOTE — Assessment & Plan Note (Signed)
A; hoarseness in smoker following resp illness. Suspect post viral laryngitis or GERD that will likely improve. If improvement does not occurs will need laryngoscope to r/o vocal cord dysfunction or laryngeal pathology.  P: PPI reassurance Close f/u

## 2015-04-02 NOTE — Assessment & Plan Note (Signed)
A: fullness in L supraclavicular fossa w/o appreciable adenopathy or tenderness. No red flags to suggest malignancy. Patient is a former smoker which places her at high risk for malignancy.  Patient has not had screening colonoscopy P: CXR GI referral for screening colonoscopy

## 2015-04-04 ENCOUNTER — Encounter: Payer: Self-pay | Admitting: Clinical

## 2015-04-04 ENCOUNTER — Ambulatory Visit (HOSPITAL_COMMUNITY)
Admission: RE | Admit: 2015-04-04 | Discharge: 2015-04-04 | Disposition: A | Discharge status: Home / Self Care | Payer: No Typology Code available for payment source | Source: Ambulatory Visit | Attending: Family Medicine | Admitting: Family Medicine

## 2015-04-04 DIAGNOSIS — J989 Respiratory disorder, unspecified: Secondary | ICD-10-CM

## 2015-04-04 NOTE — Progress Notes (Signed)
Depression screen [facility] 2/9 03/31/2015 01/13/2015  Decreased Interest 0 0  Down, Depressed, Hopeless 0 0  PHQ - 2 Score 0 0  Altered sleeping 1 -  Tired, decreased energy 0 -  Change in appetite 0 -  Feeling bad or failure about yourself  0 -  Trouble concentrating 0 -  Moving slowly or fidgety/restless 0 -  Suicidal thoughts 0 -  PHQ-9 Score 1 -    GAD 7 : Generalized Anxiety Score 03/31/2015  Nervous, Anxious, on Edge 1  Control/stop worrying 1  Worry too much - different things 1  Trouble relaxing 1  Restless 0  Easily annoyed or irritable 0  Afraid - awful might happen 0  Total GAD 7 Score 4

## 2015-04-05 ENCOUNTER — Telehealth: Payer: Self-pay | Admitting: *Deleted

## 2015-04-05 NOTE — Telephone Encounter (Signed)
-----   Message from Boykin Nearing, MD sent at 04/04/2015  4:04 PM EDT ----- Normal CXR except for chronic scarring

## 2015-04-19 ENCOUNTER — Other Ambulatory Visit: Payer: Self-pay | Admitting: Family Medicine

## 2015-04-19 MED FILL — LISINOPRIL-HCTZ 20-12.5 MG: 20-12.5 | 30 days supply | Qty: 30 | Fill #0

## 2015-04-19 MED FILL — ATORVASTATIN 40 MG TABLET: 40 | 30 days supply | Qty: 30 | Fill #3

## 2015-05-09 ENCOUNTER — Encounter: Payer: Self-pay | Admitting: Family Medicine

## 2015-05-09 ENCOUNTER — Encounter: Payer: Self-pay | Admitting: Clinical

## 2015-05-09 ENCOUNTER — Ambulatory Visit: Payer: Self-pay | Attending: Family Medicine | Admitting: Family Medicine

## 2015-05-09 ENCOUNTER — Telehealth: Payer: Self-pay | Admitting: Family Medicine

## 2015-05-09 VITALS — BP 138/93 | HR 78 | Temp 99.0°F | Resp 16 | Ht 66.0 in | Wt 172.0 lb

## 2015-05-09 DIAGNOSIS — J302 Other seasonal allergic rhinitis: Secondary | ICD-10-CM | POA: Insufficient documentation

## 2015-05-09 DIAGNOSIS — I10 Essential (primary) hypertension: Secondary | ICD-10-CM | POA: Insufficient documentation

## 2015-05-09 DIAGNOSIS — Z79899 Other long term (current) drug therapy: Secondary | ICD-10-CM | POA: Insufficient documentation

## 2015-05-09 DIAGNOSIS — Z0000 Encounter for general adult medical examination without abnormal findings: Secondary | ICD-10-CM

## 2015-05-09 DIAGNOSIS — K219 Gastro-esophageal reflux disease without esophagitis: Secondary | ICD-10-CM | POA: Insufficient documentation

## 2015-05-09 DIAGNOSIS — R490 Dysphonia: Secondary | ICD-10-CM | POA: Insufficient documentation

## 2015-05-09 DIAGNOSIS — R222 Localized swelling, mass and lump, trunk: Secondary | ICD-10-CM

## 2015-05-09 DIAGNOSIS — Z87891 Personal history of nicotine dependence: Secondary | ICD-10-CM | POA: Insufficient documentation

## 2015-05-09 MED ORDER — HYDROCHLOROTHIAZIDE 25 MG PO TABS: 25.0000 mg | ORAL_TABLET | Freq: Every day | ORAL | Status: DC

## 2015-05-09 MED ORDER — LORATADINE 10 MG PO TABS: 10.0000 mg | ORAL_TABLET | Freq: Every day | ORAL | Status: DC

## 2015-05-09 MED FILL — ?HYDROCHLOROTHIAZIDE 25 MG: 25 MG | 30 days supply | Qty: 30 | Fill #0

## 2015-05-09 NOTE — Progress Notes (Signed)
F/U acid reflex  No pain today  No suicidal thoughts in the past two weeks Former smoker - quit 2 month ago

## 2015-05-09 NOTE — Patient Instructions (Addendum)
Tammy Beck was seen today for gastroesophageal reflux.  Diagnoses and all orders for this visit:  Supraclavicular fossa fullness -     Ambulatory referral to ENT  Hoarseness of voice -     Ambulatory referral to ENT  Essential hypertension -     hydrochlorothiazide (HYDRODIURIL) 25 MG tablet; Take 1 tablet (25 mg total) by mouth daily.  Seasonal allergies -     loratadine (CLARITIN) 10 MG tablet; Take 1 tablet (10 mg total) by mouth daily.  Healthcare maintenance -     Ambulatory referral to Gastroenterology   STOP prinzide for now. Take HCTZ 25 mg daily  F/u in 4 weeks   Dr. Adrian Blackwater

## 2015-05-09 NOTE — Progress Notes (Signed)
Depression screen [facility] 2/9 05/09/2015 03/31/2015 01/13/2015  Decreased Interest 0 0 0  Down, Depressed, Hopeless 0 0 0  PHQ - 2 Score 0 0 0  Altered sleeping 2 1 -  Tired, decreased energy 1 0 -  Change in appetite 0 0 -  Feeling bad or failure about yourself  0 0 -  Trouble concentrating 0 0 -  Moving slowly or fidgety/restless 0 0 -  Suicidal thoughts 0 0 -  PHQ-9 Score 3 1 -    GAD 7 : Generalized Anxiety Score 05/09/2015 03/31/2015  Nervous, Anxious, on Edge - 1  Control/stop worrying 1 1  Worry too much - different things 1 1  Trouble relaxing 1 1  Restless 0 0  Easily annoyed or irritable 1 0  Afraid - awful might happen 1 0  Total GAD 7 Score - 4

## 2015-05-09 NOTE — Telephone Encounter (Signed)
Pt. Called stating she was referred out by her PCP to have a colonoscopy done. Pt. Received a call today to schedule that appointment but they needs her records from the last time she had the colonoscopy done, and pt. Said she signed a release form the last time she was here for all of her records to be sent to [facility]. Please f/u with pt.

## 2015-05-09 NOTE — Assessment & Plan Note (Signed)
STOP prinzide for now. To see if throat clearing improves off lisinopril  Take HCTZ 25 mg daily.

## 2015-05-09 NOTE — Progress Notes (Signed)
Subjective:  Patient ID: Tammy Beck, female    DOB: 03-21-1955  Age: 60 y.o. MRN: [id number]  CC: Gastroesophageal Reflux   HPI Tammy Beck presents for   1. Hoarseness: improved with protonix. Still present. Feels need to clear throat often. Has quit smoking. Has throat soreness. No chest pain or throat pain.  2. supracervical fullness: persistent. Both side. Non tender. No fever, chills, weight loss. Former smoker.   3. HTN: on prinzide. No HA, CP or SOB. No swelling. Has need to clear throat without specific cough.    Social History  Substance Use Topics  . Smoking status: Former Smoker -- 0.50 packs/day    Types: Cigarettes    Quit date: 03/17/2015  . Smokeless tobacco: Never Used  . Alcohol Use: 0.0 oz/week    0 Standard drinks or equivalent per week     Comment: occ     Outpatient Prescriptions Prior to Visit  Medication Sig Dispense Refill  . atorvastatin (LIPITOR) 40 MG tablet Take 1 tablet (40 mg total) by mouth daily. 30 tablet 11  . Blood Glucose Monitoring Suppl (TRUE METRIX METER) w/Device KIT 1 each by Does not apply route as needed. 1 kit 0  . glucose blood (TRUE METRIX BLOOD GLUCOSE TEST) test strip 1 each by Other route 3 (three) times daily. 100 each 11  . lisinopril-hydrochlorothiazide (PRINZIDE,ZESTORETIC) 20-12.5 MG tablet TAKE 1 TABLET BY MOUTH DAILY. 30 tablet 2  . pantoprazole (PROTONIX) 40 MG tablet Take 1 tablet (40 mg total) by mouth daily. 30 tablet 3  . TRUEPLUS LANCETS 28G MISC 1 each by Does not apply route 3 (three) times daily. 100 each 11  . Cholecalciferol (VITAMIN D) 2000 units tablet Take 1 tablet (2,000 Units total) by mouth daily. 30 tablet 2  . Vitamin D, Ergocalciferol, (DRISDOL) 50000 units CAPS capsule Take 1 capsule (50,000 Units total) by mouth every 7 (seven) days. For 8 weeks 8 capsule 0   No facility-administered medications prior to visit.    ROS Review of Systems  Constitutional: Negative for fever and chills.  HENT:  Positive for voice change.   Eyes: Negative for visual disturbance.  Respiratory: Negative for shortness of breath.   Cardiovascular: Negative for chest pain.  Gastrointestinal: Negative for abdominal pain and blood in stool.  Musculoskeletal: Negative for back pain and arthralgias.  Skin: Negative for rash.  Allergic/Immunologic: Negative for immunocompromised state.  Hematological: Negative for adenopathy. Does not bruise/bleed easily.  Psychiatric/Behavioral: Negative for suicidal ideas and dysphoric mood.    Objective:  BP 138/93 mmHg  Pulse 78  Temp(Src) 99 F (37.2 C) (Oral)  Resp 16  Ht '5\' 6"'  (1.676 m)  Wt 172 lb (78.019 kg)  BMI 27.77 kg/m2  SpO2 99%  LMP  (Exact Date)  BP/Weight 05/09/2015 03/31/2015 4/48/1856  Systolic BP 314 970 263  Diastolic BP 93 77 86  Wt. (Lbs) 172 168 176  BMI 27.77 27.96 29.29    Physical Exam  Constitutional: She is oriented to person, place, and time. She appears well-developed and well-nourished. No distress.  HENT:  Head: Normocephalic and atraumatic.  Neck: Normal range of motion. Neck supple. No tracheal deviation present. No thyromegaly present.    Cardiovascular: Normal rate, regular rhythm, normal heart sounds and intact distal pulses.   Pulmonary/Chest: Effort normal and breath sounds normal. No stridor.  Musculoskeletal: She exhibits no edema.  Lymphadenopathy:    She has no cervical adenopathy.  Neurological: She is alert and oriented to person, place, and  time.  Skin: Skin is warm and dry. No rash noted.  Psychiatric: She has a normal mood and affect.     Assessment & Plan:   There are no diagnoses linked to this encounter. [name] was seen today for gastroesophageal reflux.  Diagnoses and all orders for this visit:  Supraclavicular fossa fullness -     Ambulatory referral to ENT  Hoarseness of voice -     Ambulatory referral to ENT  Essential hypertension -     hydrochlorothiazide (HYDRODIURIL) 25 MG tablet;  Take 1 tablet (25 mg total) by mouth daily.  Seasonal allergies -     loratadine (CLARITIN) 10 MG tablet; Take 1 tablet (10 mg total) by mouth daily.  Healthcare maintenance -     Ambulatory referral to Gastroenterology   Meds ordered this encounter  Medications  . hydrochlorothiazide (HYDRODIURIL) 25 MG tablet    Sig: Take 1 tablet (25 mg total) by mouth daily.    Dispense:  30 tablet    Refill:  0  . loratadine (CLARITIN) 10 MG tablet    Sig: Take 1 tablet (10 mg total) by mouth daily.    Dispense:  30 tablet    Refill:  11    Follow-up: No Follow-up on file.   Boykin Nearing MD

## 2015-05-09 NOTE — Assessment & Plan Note (Signed)
Improved with smoking cessation and PPI Patient worried given her long smoking hx  ENT referral for possible scope to evaluate larynx

## 2015-05-10 NOTE — Telephone Encounter (Signed)
I don't see any records in epic

## 2015-05-16 NOTE — Telephone Encounter (Signed)
I have not yet received records Patient asked to come in to sign another release and provide the information about her last colonscopy

## 2015-06-23 ENCOUNTER — Other Ambulatory Visit: Payer: Self-pay | Admitting: Family Medicine

## 2015-06-23 MED FILL — HYDROCHLOROTHIAZIDE 25 MG T: 25 | 30 days supply | Qty: 30 | Fill #0

## 2015-06-30 ENCOUNTER — Telehealth: Payer: Self-pay | Admitting: Family Medicine

## 2015-06-30 NOTE — Telephone Encounter (Signed)
We don't have the records for the gi from 2014 .Dr Adrian Blackwater can have access to it  Thank You

## 2015-06-30 NOTE — Telephone Encounter (Signed)
Patient called stating that [facility] has not received office notes to perfom colonoscopy. Please follow up.

## 2015-07-03 NOTE — Telephone Encounter (Signed)
No, I don't know have records of a previous c-scope. Please ask patient where she had her previous scope and to sign a release for records

## 2015-07-05 NOTE — Telephone Encounter (Signed)
I spoke to Tammy Beck and she is going to pass by on Tuesday to [facility] to sign a release form to get her records from previous colonoscopy to sent it to [facility]

## 2015-07-07 ENCOUNTER — Ambulatory Visit: Payer: No Typology Code available for payment source | Admitting: Family Medicine

## 2015-07-11 DIAGNOSIS — K219 Gastro-esophageal reflux disease without esophagitis: Secondary | ICD-10-CM | POA: Insufficient documentation

## 2015-07-11 DIAGNOSIS — J381 Polyp of vocal cord and larynx: Secondary | ICD-10-CM | POA: Insufficient documentation

## 2015-07-11 MED FILL — OMEPRAZOLE DR 40 MG CAPSULE: 40 | 30 days supply | Qty: 60 | Fill #0

## 2015-07-24 ENCOUNTER — Other Ambulatory Visit: Payer: Self-pay | Admitting: Obstetrics and Gynecology

## 2015-07-24 ENCOUNTER — Telehealth (HOSPITAL_COMMUNITY): Payer: Self-pay | Admitting: *Deleted

## 2015-07-24 DIAGNOSIS — N6489 Other specified disorders of breast: Secondary | ICD-10-CM

## 2015-07-24 NOTE — Telephone Encounter (Signed)
Patient returned call. Advised patient to call and schedule 6 month follow up with the [facility]. Patient voiced understanding.

## 2015-07-24 NOTE — Telephone Encounter (Signed)
Telephoned patient at home # and left message to return call to [facility]. Needs to schedule with the [facility]

## 2015-07-25 ENCOUNTER — Ambulatory Visit: Payer: No Typology Code available for payment source | Admitting: Family Medicine

## 2015-07-31 ENCOUNTER — Encounter: Payer: Self-pay | Admitting: Family Medicine

## 2015-08-03 ENCOUNTER — Encounter: Payer: Self-pay | Admitting: Family Medicine

## 2015-08-03 ENCOUNTER — Ambulatory Visit: Payer: Self-pay | Attending: Family Medicine | Admitting: Family Medicine

## 2015-08-03 VITALS — BP 139/86 | HR 80 | Temp 98.5°F | Ht 66.0 in | Wt 167.2 lb

## 2015-08-03 DIAGNOSIS — E119 Type 2 diabetes mellitus without complications: Secondary | ICD-10-CM

## 2015-08-03 DIAGNOSIS — I10 Essential (primary) hypertension: Secondary | ICD-10-CM

## 2015-08-03 DIAGNOSIS — K219 Gastro-esophageal reflux disease without esophagitis: Secondary | ICD-10-CM

## 2015-08-03 DIAGNOSIS — R222 Localized swelling, mass and lump, trunk: Secondary | ICD-10-CM

## 2015-08-03 DIAGNOSIS — E785 Hyperlipidemia, unspecified: Secondary | ICD-10-CM

## 2015-08-03 LAB — GLUCOSE, POCT (MANUAL RESULT ENTRY): POC GLUCOSE: 163 mg/dL — AB (ref 70–99)

## 2015-08-03 LAB — POCT GLYCOSYLATED HEMOGLOBIN (HGB A1C): HEMOGLOBIN A1C: 5.7

## 2015-08-03 MED ORDER — ATORVASTATIN CALCIUM 10 MG PO TABS: 10.0000 mg | ORAL_TABLET | Freq: Every day | ORAL | Status: DC

## 2015-08-03 MED ORDER — HYDROCHLOROTHIAZIDE 25 MG PO TABS: 25.0000 mg | ORAL_TABLET | Freq: Every day | ORAL | Status: DC

## 2015-08-03 MED FILL — ATORVASTATIN 10 MG TABLET: 10 | 90 days supply | Qty: 90 | Fill #0

## 2015-08-03 MED FILL — HYDROCHLOROTHIAZIDE 25 MG T: 25 | 90 days supply | Qty: 90 | Fill #0

## 2015-08-03 NOTE — Progress Notes (Signed)
Subjective:  Patient ID: Tammy Beck, female    DOB: August 28, 1955  Age: 60 y.o. MRN: [id number]  CC: Follow-up and Gastroesophageal Reflux   HPI Faduma Cho has hx HTN, HLD, diabetes,  former smoker, GERD, presents for   1. GERD: taking prilosec. Has resolution in hoarseness and sore throat. She went to ENT and had direct laryngoscopy there was evidence of acid irritation but no lesions. She is no longer smoking.  2. HTN: taking HCTZ. No HA, CP, SOB or edema.   3. HLD: taking lipitor 40 mg daily. She believes this is causing her fullness above her clavicles and would like to decrease back down to the 10 mg dose. She denies muscle aches and neck pain.  4. Records: she is attempting to schedule a c-sope. She does not recall her last one. She has signed release to obtain records from her previous PCP, [facility]. No records have yet been received.   5. Abnormal mammogram: she has scheduled her 6 month f/u for her abnormal R breast mammogram. No breast pain.   Social History  Substance Use Topics  . Smoking status: Former Smoker -- 0.50 packs/day    Types: Cigarettes    Quit date: 03/17/2015  . Smokeless tobacco: Never Used  . Alcohol Use: 0.0 oz/week    0 Standard drinks or equivalent per week     Comment: occ    Outpatient Prescriptions Prior to Visit  Medication Sig Dispense Refill  . atorvastatin (LIPITOR) 40 MG tablet Take 1 tablet (40 mg total) by mouth daily. 30 tablet 11  . Blood Glucose Monitoring Suppl (TRUE METRIX METER) w/Device KIT 1 each by Does not apply route as needed. 1 kit 0  . glucose blood (TRUE METRIX BLOOD GLUCOSE TEST) test strip 1 each by Other route 3 (three) times daily. 100 each 11  . hydrochlorothiazide (HYDRODIURIL) 25 MG tablet TAKE 1 TABLET BY MOUTH DAILY. 30 tablet 0  . loratadine (CLARITIN) 10 MG tablet Take 1 tablet (10 mg total) by mouth daily. 30 tablet 11  . TRUEPLUS LANCETS 28G MISC 1 each by Does not apply route  3 (three) times daily. 100 each 11  . pantoprazole (PROTONIX) 40 MG tablet Take 1 tablet (40 mg total) by mouth daily. 30 tablet 3   No facility-administered medications prior to visit.    ROS Review of Systems  Constitutional: Negative for fever and chills.  HENT: Negative for voice change.   Eyes: Negative for visual disturbance.  Respiratory: Negative for shortness of breath.   Cardiovascular: Negative for chest pain.  Gastrointestinal: Negative for abdominal pain and blood in stool.  Musculoskeletal: Negative for back pain and arthralgias.  Skin: Negative for rash.  Allergic/Immunologic: Negative for immunocompromised state.  Hematological: Negative for adenopathy. Does not bruise/bleed easily.  Psychiatric/Behavioral: Negative for suicidal ideas and dysphoric mood.    Objective:  BP 139/86 mmHg  Pulse 80  Temp(Src) 98.5 F (36.9 C) (Oral)  Ht '5\' 6"'  (1.676 m)  Wt 167 lb 3.2 oz (75.841 kg)  BMI 27.00 kg/m2  LMP  (Exact Date)  BP/Weight 08/03/2015 05/09/2015 0/93/1121  Systolic BP 624 469 507  Diastolic BP 86 93 77  Wt. (Lbs) 167.2 172 168  BMI 27 27.77 27.96    Physical Exam  Constitutional: She is oriented to person, place, and time. She appears well-developed and well-nourished. No distress.  HENT:  Head: Normocephalic and atraumatic.  Neck: Normal range of motion. Neck supple. No tracheal deviation  present. No thyromegaly present.  Pulmonary/Chest: Effort normal and breath sounds normal.  Musculoskeletal: She exhibits no edema.  Lymphadenopathy:    She has no cervical adenopathy.  Neurological: She is alert and oriented to person, place, and time.  Skin: Skin is warm and dry. No rash noted.  Psychiatric: She has a normal mood and affect.   Lab Results  Component Value Date   HGBA1C 5.7 08/03/2015   CBG 163   Assessment & Plan:  Makalia was seen today for follow-up and gastroesophageal reflux.  Diagnoses and all orders for this visit:  Hyperlipidemia -      atorvastatin (LIPITOR) 10 MG tablet; Take 1 tablet (10 mg total) by mouth daily.  Controlled type 2 diabetes mellitus without complication, without long-term current use of insulin (HCC) -     POCT A1C -     Glucose (CBG) -     atorvastatin (LIPITOR) 10 MG tablet; Take 1 tablet (10 mg total) by mouth daily.  Essential hypertension -     hydrochlorothiazide (HYDRODIURIL) 25 MG tablet; Take 1 tablet (25 mg total) by mouth daily.  Gastroesophageal reflux disease, esophagitis presence not specified    No orders of the defined types were placed in this encounter.    Follow-up: Return in about 3 months (around 11/03/2015) for HTN and diabetes .   Boykin Nearing MD

## 2015-08-03 NOTE — Assessment & Plan Note (Signed)
A: she has quit smoking and has well controlled diabetes P: Decrease lipitor from 40 to 10 mg per patient request

## 2015-08-03 NOTE — Assessment & Plan Note (Signed)
Well-controlled.  Continue HCTZ 25 mg daily.  

## 2015-08-03 NOTE — Assessment & Plan Note (Signed)
GERD improved with prilosec  Plan: Continue prilosec

## 2015-08-03 NOTE — Patient Instructions (Addendum)
Tammy Beck was seen today for follow-up and gastroesophageal reflux.  Diagnoses and all orders for this visit:  Hyperlipidemia -     atorvastatin (LIPITOR) 10 MG tablet; Take 1 tablet (10 mg total) by mouth daily.  Controlled type 2 diabetes mellitus without complication, without long-term current use of insulin (HCC) -     POCT A1C -     Glucose (CBG) -     atorvastatin (LIPITOR) 10 MG tablet; Take 1 tablet (10 mg total) by mouth daily.  Essential hypertension -     hydrochlorothiazide (HYDRODIURIL) 25 MG tablet; Take 1 tablet (25 mg total) by mouth daily.   We called your previous PCP office. They informed us that they did receive your release and have mailed your records yesterday 08/02/2015.  I will review your records and have pertinent info scanned into your chart.  Your colonoscopy records with be forwarded to GI.   F/u in 3 months for HTN and diabetes   Dr. Adrian Blackwater

## 2015-08-03 NOTE — Assessment & Plan Note (Signed)
Benign fatty fullness Reassurance

## 2015-08-04 ENCOUNTER — Telehealth: Payer: Self-pay | Admitting: Family Medicine

## 2015-08-04 NOTE — Telephone Encounter (Signed)
Called patient Left VM requesting a call back  Medical records from previous PCP in [place] received Colonoscopy report faxed to [facility]  Pertinent info copied and placed in to be scanned

## 2015-08-07 ENCOUNTER — Telehealth: Payer: Self-pay | Admitting: Family Medicine

## 2015-08-07 ENCOUNTER — Telehealth: Payer: Self-pay | Admitting: Gastroenterology

## 2015-08-07 NOTE — Telephone Encounter (Signed)
Previous colon report received from [facility] and placed on Dr. Woodward Ku desk for review

## 2015-08-07 NOTE — Telephone Encounter (Signed)
Pt. Returned call. Please f/u °

## 2015-08-08 ENCOUNTER — Other Ambulatory Visit: Payer: No Typology Code available for payment source

## 2015-08-08 NOTE — Telephone Encounter (Signed)
Pt states she received a call from the gastroenterology office to schedule her colonoscopy. Pt states she doesn't have any questions or concerns

## 2015-08-10 ENCOUNTER — Telehealth: Payer: Self-pay

## 2015-08-10 NOTE — Telephone Encounter (Signed)
Patient was informed that medical records are ready to be picked up.

## 2015-08-24 ENCOUNTER — Ambulatory Visit
Admission: RE | Admit: 2015-08-24 | Discharge: 2015-08-24 | Disposition: A | Discharge status: Home / Self Care | Payer: No Typology Code available for payment source | Source: Ambulatory Visit | Attending: Obstetrics and Gynecology | Admitting: Obstetrics and Gynecology

## 2015-08-24 DIAGNOSIS — N6489 Other specified disorders of breast: Secondary | ICD-10-CM

## 2015-08-31 ENCOUNTER — Encounter: Payer: Self-pay | Admitting: Gastroenterology

## 2015-10-19 NOTE — Telephone Encounter (Signed)
Pt is scheduled for colon on 11-13-15

## 2015-11-13 ENCOUNTER — Encounter: Payer: No Typology Code available for payment source | Admitting: Gastroenterology

## 2015-12-04 MED FILL — HYDROCHLOROTHIAZIDE 25 MG T: 25 | 90 days supply | Qty: 90 | Fill #1

## 2015-12-04 MED FILL — ATORVASTATIN 10 MG TABLET: 10 | 90 days supply | Qty: 90 | Fill #1

## 2016-04-03 MED FILL — ATORVASTATIN 10 MG TABLET: 10 | 90 days supply | Qty: 90 | Fill #2

## 2016-04-03 MED FILL — HYDROCHLOROTHIAZIDE 25 MG T: 25 | 90 days supply | Qty: 90 | Fill #2

## 2016-07-10 ENCOUNTER — Other Ambulatory Visit: Payer: Self-pay | Admitting: Family Medicine

## 2016-07-10 DIAGNOSIS — I10 Essential (primary) hypertension: Secondary | ICD-10-CM

## 2016-07-10 DIAGNOSIS — E119 Type 2 diabetes mellitus without complications: Secondary | ICD-10-CM

## 2016-07-10 DIAGNOSIS — E785 Hyperlipidemia, unspecified: Secondary | ICD-10-CM

## 2016-07-10 MED FILL — ATORVASTATIN 10 MG TABLET: 10 | 90 days supply | Qty: 90 | Fill #3

## 2016-07-10 MED FILL — HYDROCHLOROTHIAZIDE 25 MG T: 25 | 90 days supply | Qty: 90 | Fill #3

## 2016-10-03 NOTE — Telephone Encounter (Signed)
Patient has never rescheduled appointments. Records will be shredded.

## 2016-11-28 ENCOUNTER — Telehealth: Payer: Self-pay | Admitting: Nurse Practitioner

## 2016-11-28 ENCOUNTER — Other Ambulatory Visit: Payer: Self-pay | Admitting: Nurse Practitioner

## 2016-11-28 DIAGNOSIS — E785 Hyperlipidemia, unspecified: Secondary | ICD-10-CM

## 2016-11-28 DIAGNOSIS — I10 Essential (primary) hypertension: Secondary | ICD-10-CM

## 2016-11-28 DIAGNOSIS — E119 Type 2 diabetes mellitus without complications: Secondary | ICD-10-CM

## 2016-11-28 MED ORDER — HYDROCHLOROTHIAZIDE 25 MG PO TABS: 25.0000 mg | ORAL_TABLET | Freq: Every day | ORAL | 0 refills | Status: DC

## 2016-11-28 MED ORDER — ATORVASTATIN CALCIUM 10 MG PO TABS: 10.0000 mg | ORAL_TABLET | Freq: Every day | ORAL | 0 refills | Status: DC

## 2016-11-28 MED FILL — ?ATORVASTATIN 10 MG TABLET: 10 | 30 days supply | Qty: 30 | Fill #0

## 2016-11-28 MED FILL — HYDROCHLOROTHIAZIDE 25 MG T: 25 | 30 days supply | Qty: 30 | Fill #0

## 2016-11-28 NOTE — Telephone Encounter (Signed)
Patient has not been seen since 07/2015. Will forward to provider patient is establishing care with for approval/denial.

## 2016-11-28 NOTE — Telephone Encounter (Signed)
I called pt to informed of the refill but was unable to leave msg since her voice mail is not set up

## 2016-11-28 NOTE — Telephone Encounter (Signed)
Refilled for 30 days only ?

## 2016-11-28 NOTE — Telephone Encounter (Signed)
Pt called to request a refill for  atorvastatin (LIPITOR) 10 MG tablet  hydrochlorothiazide (HYDRODIURIL) 25 MG tablet   Pt need the med until she see the new pcp on 12/18/16 Please sent it to [facility] pharmacy if you auth, please let the pt know if you auth the refill or not

## 2016-12-18 ENCOUNTER — Ambulatory Visit: Payer: Self-pay | Admitting: Nurse Practitioner

## 2016-12-27 ENCOUNTER — Ambulatory Visit: Payer: Self-pay | Attending: Nurse Practitioner | Admitting: Nurse Practitioner

## 2016-12-27 ENCOUNTER — Encounter: Payer: Self-pay | Admitting: Nurse Practitioner

## 2016-12-27 VITALS — BP 146/91 | HR 88 | Temp 99.6°F | Ht 65.0 in | Wt 179.6 lb

## 2016-12-27 DIAGNOSIS — Z1211 Encounter for screening for malignant neoplasm of colon: Secondary | ICD-10-CM

## 2016-12-27 DIAGNOSIS — E119 Type 2 diabetes mellitus without complications: Secondary | ICD-10-CM

## 2016-12-27 DIAGNOSIS — I10 Essential (primary) hypertension: Secondary | ICD-10-CM

## 2016-12-27 DIAGNOSIS — Z79899 Other long term (current) drug therapy: Secondary | ICD-10-CM | POA: Insufficient documentation

## 2016-12-27 DIAGNOSIS — Z833 Family history of diabetes mellitus: Secondary | ICD-10-CM | POA: Insufficient documentation

## 2016-12-27 DIAGNOSIS — Z808 Family history of malignant neoplasm of other organs or systems: Secondary | ICD-10-CM | POA: Insufficient documentation

## 2016-12-27 DIAGNOSIS — F17200 Nicotine dependence, unspecified, uncomplicated: Secondary | ICD-10-CM

## 2016-12-27 DIAGNOSIS — Z8249 Family history of ischemic heart disease and other diseases of the circulatory system: Secondary | ICD-10-CM | POA: Insufficient documentation

## 2016-12-27 DIAGNOSIS — Z716 Tobacco abuse counseling: Secondary | ICD-10-CM

## 2016-12-27 DIAGNOSIS — R21 Rash and other nonspecific skin eruption: Secondary | ICD-10-CM

## 2016-12-27 DIAGNOSIS — E785 Hyperlipidemia, unspecified: Secondary | ICD-10-CM | POA: Insufficient documentation

## 2016-12-27 DIAGNOSIS — E1169 Type 2 diabetes mellitus with other specified complication: Secondary | ICD-10-CM

## 2016-12-27 DIAGNOSIS — Z9851 Tubal ligation status: Secondary | ICD-10-CM | POA: Insufficient documentation

## 2016-12-27 LAB — GLUCOSE, POCT (MANUAL RESULT ENTRY): POC GLUCOSE: 109 mg/dL — AB (ref 70–99)

## 2016-12-27 LAB — POCT GLYCOSYLATED HEMOGLOBIN (HGB A1C): HEMOGLOBIN A1C: 5.9

## 2016-12-27 MED ORDER — AMLODIPINE BESYLATE 5 MG PO TABS: 5.0000 mg | ORAL_TABLET | Freq: Every day | ORAL | 0 refills | Status: DC

## 2016-12-27 MED ORDER — TRIAMCINOLONE ACETONIDE 0.5 % EX OINT: 1.0000 "application " | TOPICAL_OINTMENT | Freq: Two times a day (BID) | CUTANEOUS | 0 refills | Status: AC

## 2016-12-27 MED ORDER — HYDROCHLOROTHIAZIDE 25 MG PO TABS: 25.0000 mg | ORAL_TABLET | Freq: Every day | ORAL | 1 refills | Status: DC

## 2016-12-27 MED ORDER — ATORVASTATIN CALCIUM 10 MG PO TABS: 10.0000 mg | ORAL_TABLET | Freq: Every day | ORAL | 1 refills | Status: DC

## 2016-12-27 MED FILL — ?ATORVASTATIN 10 MG TABLET: 10 | 30 days supply | Qty: 30 | Fill #0

## 2016-12-27 MED FILL — TRIAMCINOLONE 0.5% OINTMENT: 0.5 | 30 days supply | Qty: 30 | Fill #0

## 2016-12-27 MED FILL — HYDROCHLOROTHIAZIDE 25 MG T: 25 | 30 days supply | Qty: 30 | Fill #0

## 2016-12-27 MED FILL — AMLODIPINE BESYLATE 5 MG TA: 5 | 30 days supply | Qty: 30 | Fill #0

## 2016-12-27 NOTE — Patient Instructions (Addendum)
Make sure your blood pressure does not run below 100/60.    DASH Eating Plan DASH stands for "Dietary Approaches to Stop Hypertension." The DASH eating plan is a healthy eating plan that has been shown to reduce high blood pressure (hypertension). It may also reduce your risk for type 2 diabetes, heart disease, and stroke. The DASH eating plan may also help with weight loss. What are tips for following this plan? General guidelines  Avoid eating more than 2,300 mg (milligrams) of salt (sodium) a day. If you have hypertension, you may need to reduce your sodium intake to 1,500 mg a day.  Limit alcohol intake to no more than 1 drink a day for nonpregnant women and 2 drinks a day for men. One drink equals 12 oz of beer, 5 oz of wine, or 1 oz of hard liquor.  Work with your health care provider to maintain a healthy body weight or to lose weight. Ask what an ideal weight is for you.  Get at least 30 minutes of exercise that causes your heart to beat faster (aerobic exercise) most days of the week. Activities may include walking, swimming, or biking.  Work with your health care provider or diet and nutrition specialist (dietitian) to adjust your eating plan to your individual calorie needs. Reading food labels  Check food labels for the amount of sodium per serving. Choose foods with less than 5 percent of the Daily Value of sodium. Generally, foods with less than 300 mg of sodium per serving fit into this eating plan.  To find whole grains, look for the word "whole" as the first word in the ingredient list. Shopping  Buy products labeled as "low-sodium" or "no salt added."  Buy fresh foods. Avoid canned foods and premade or frozen meals. Cooking  Avoid adding salt when cooking. Use salt-free seasonings or herbs instead of table salt or sea salt. Check with your health care provider or pharmacist before using salt substitutes.  Do not fry foods. Cook foods using healthy methods such as  baking, boiling, grilling, and broiling instead.  Cook with heart-healthy oils, such as olive, canola, soybean, or sunflower oil. Meal planning   Eat a balanced diet that includes: ? 5 or more servings of fruits and vegetables each day. At each meal, try to fill half of your plate with fruits and vegetables. ? Up to 6-8 servings of whole grains each day. ? Less than 6 oz of lean meat, poultry, or fish each day. A 3-oz serving of meat is about the same size as a deck of cards. One egg equals 1 oz. ? 2 servings of low-fat dairy each day. ? A serving of nuts, seeds, or beans 5 times each week. ? Heart-healthy fats. Healthy fats called Omega-3 fatty acids are found in foods such as flaxseeds and coldwater fish, like sardines, salmon, and mackerel.  Limit how much you eat of the following: ? Canned or prepackaged foods. ? Food that is high in trans fat, such as fried foods. ? Food that is high in saturated fat, such as fatty meat. ? Sweets, desserts, sugary drinks, and other foods with added sugar. ? Full-fat dairy products.  Do not salt foods before eating.  Try to eat at least 2 vegetarian meals each week.  Eat more home-cooked food and less restaurant, buffet, and fast food.  When eating at a restaurant, ask that your food be prepared with less salt or no salt, if possible. What foods are recommended? The items  listed may not be a complete list. Talk with your dietitian about what dietary choices are best for you. Grains Whole-grain or whole-wheat bread. Whole-grain or whole-wheat pasta. Brown rice. [place]. Bulgur. Whole-grain and low-sodium cereals. Pita bread. Low-fat, low-sodium crackers. Whole-wheat flour tortillas. Vegetables Fresh or frozen vegetables (raw, steamed, roasted, or grilled). Low-sodium or reduced-sodium tomato and vegetable juice. Low-sodium or reduced-sodium tomato sauce and tomato paste. Low-sodium or reduced-sodium canned vegetables. Fruits All fresh,  dried, or frozen fruit. Canned fruit in natural juice (without added sugar). Meat and other protein foods Skinless chicken or [place]. Ground chicken or [place]. Pork with fat trimmed off. Fish and seafood. Egg whites. Dried beans, peas, or lentils. Unsalted nuts, nut butters, and seeds. Unsalted canned beans. Lean cuts of beef with fat trimmed off. Low-sodium, lean deli meat. Dairy Low-fat (1%) or fat-free (skim) milk. Fat-free, low-fat, or reduced-fat cheeses. Nonfat, low-sodium ricotta or cottage cheese. Low-fat or nonfat yogurt. Low-fat, low-sodium cheese. Fats and oils Soft margarine without trans fats. Vegetable oil. Low-fat, reduced-fat, or light mayonnaise and salad dressings (reduced-sodium). Canola, safflower, olive, soybean, and sunflower oils. Avocado. Seasoning and other foods Herbs. Spices. Seasoning mixes without salt. Unsalted popcorn and pretzels. Fat-free sweets. What foods are not recommended? The items listed may not be a complete list. Talk with your dietitian about what dietary choices are best for you. Grains Baked goods made with fat, such as croissants, muffins, or some breads. Dry pasta or rice meal packs. Vegetables Creamed or fried vegetables. Vegetables in a cheese sauce. Regular canned vegetables (not low-sodium or reduced-sodium). Regular canned tomato sauce and paste (not low-sodium or reduced-sodium). Regular tomato and vegetable juice (not low-sodium or reduced-sodium). Angie Fava. Olives. Fruits Canned fruit in a light or heavy syrup. Fried fruit. Fruit in cream or butter sauce. Meat and other protein foods Fatty cuts of meat. Ribs. Fried meat. Berniece Salines. Sausage. Bologna and other processed lunch meats. Salami. Fatback. Hotdogs. Bratwurst. Salted nuts and seeds. Canned beans with added salt. Canned or smoked fish. Whole eggs or egg yolks. Chicken or [place] with skin. Dairy Whole or 2% milk, cream, and half-and-half. Whole or full-fat cream cheese. Whole-fat or sweetened  yogurt. Full-fat cheese. Nondairy creamers. Whipped toppings. Processed cheese and cheese spreads. Fats and oils Butter. Stick margarine. Lard. Shortening. Ghee. Bacon fat. Tropical oils, such as coconut, palm kernel, or palm oil. Seasoning and other foods Salted popcorn and pretzels. Onion salt, garlic salt, seasoned salt, table salt, and sea salt. Worcestershire sauce. Tartar sauce. Barbecue sauce. Teriyaki sauce. Soy sauce, including reduced-sodium. Steak sauce. Canned and packaged gravies. Fish sauce. Oyster sauce. Cocktail sauce. Horseradish that you find on the shelf. Ketchup. Mustard. Meat flavorings and tenderizers. Bouillon cubes. Hot sauce and Tabasco sauce. Premade or packaged marinades. Premade or packaged taco seasonings. Relishes. Regular salad dressings. Where to find more information:  National Heart, Lung, and [place]: https://wilson-eaton.com/  American Heart Association: www.heart.org Summary  The DASH eating plan is a healthy eating plan that has been shown to reduce high blood pressure (hypertension). It may also reduce your risk for type 2 diabetes, heart disease, and stroke.  With the DASH eating plan, you should limit salt (sodium) intake to 2,300 mg a day. If you have hypertension, you may need to reduce your sodium intake to 1,500 mg a day.  When on the DASH eating plan, aim to eat more fresh fruits and vegetables, whole grains, lean proteins, low-fat dairy, and heart-healthy fats.  Work with your health care provider or  diet and nutrition specialist (dietitian) to adjust your eating plan to your individual calorie needs. This information is not intended to replace advice given to you by your health care provider. Make sure you discuss any questions you have with your health care provider. Document Released: 12/20/2010 Document Revised: 12/25/2015 Document Reviewed: 12/25/2015 Elsevier Interactive Patient Education  2017 Elsevier Inc.  Hypertension Hypertension is  another name for high blood pressure. High blood pressure forces your heart to work harder to pump blood. This can cause problems over time. There are two numbers in a blood pressure reading. There is a top number (systolic) over a bottom number (diastolic). It is best to have a blood pressure below 120/80. Healthy choices can help lower your blood pressure. You may need medicine to help lower your blood pressure if:  Your blood pressure cannot be lowered with healthy choices.  Your blood pressure is higher than 130/80.  Follow these instructions at home: Eating and drinking  If directed, follow the DASH eating plan. This diet includes: ? Filling half of your plate at each meal with fruits and vegetables. ? Filling one quarter of your plate at each meal with whole grains. Whole grains include whole wheat pasta, brown rice, and whole grain bread. ? Eating or drinking low-fat dairy products, such as skim milk or low-fat yogurt. ? Filling one quarter of your plate at each meal with low-fat (lean) proteins. Low-fat proteins include fish, skinless chicken, eggs, beans, and tofu. ? Avoiding fatty meat, cured and processed meat, or chicken with skin. ? Avoiding premade or processed food.  Eat less than 1,500 mg of salt (sodium) a day.  Limit alcohol use to no more than 1 drink a day for nonpregnant women and 2 drinks a day for men. One drink equals 12 oz of beer, 5 oz of wine, or 1 oz of hard liquor. Lifestyle  Work with your doctor to stay at a healthy weight or to lose weight. Ask your doctor what the best weight is for you.  Get at least 30 minutes of exercise that causes your heart to beat faster (aerobic exercise) most days of the week. This may include walking, swimming, or biking.  Get at least 30 minutes of exercise that strengthens your muscles (resistance exercise) at least 3 days a week. This may include lifting weights or pilates.  Do not use any products that contain nicotine or  tobacco. This includes cigarettes and e-cigarettes. If you need help quitting, ask your doctor.  Check your blood pressure at home as told by your doctor.  Keep all follow-up visits as told by your doctor. This is important. Medicines  Take over-the-counter and prescription medicines only as told by your doctor. Follow directions carefully.  Do not skip doses of blood pressure medicine. The medicine does not work as well if you skip doses. Skipping doses also puts you at risk for problems.  Ask your doctor about side effects or reactions to medicines that you should watch for. Contact a doctor if:  You think you are having a reaction to the medicine you are taking.  You have headaches that keep coming back (recurring).  You feel dizzy.  You have swelling in your ankles.  You have trouble with your vision. Get help right away if:  You get a very bad headache.  You start to feel confused.  You feel weak or numb.  You feel faint.  You get very bad pain in your: ? Chest. ? Belly (abdomen).  You throw up (vomit) more than once.  You have trouble breathing. Summary  Hypertension is another name for high blood pressure.  Making healthy choices can help lower blood pressure. If your blood pressure cannot be controlled with healthy choices, you may need to take medicine. This information is not intended to replace advice given to you by your health care provider. Make sure you discuss any questions you have with your health care provider. Document Released: 06/19/2007 Document Revised: 11/29/2015 Document Reviewed: 11/29/2015 Elsevier Interactive Patient Education  [employer].

## 2016-12-27 NOTE — Progress Notes (Signed)
Assessment & Plan:  Tammy Beck was seen today for establish care.  Diagnoses and all orders for this visit:  Controlled type 2 diabetes mellitus without complication, without long-term current use of insulin (HCC) -     Glucose (CBG) -     HgB A1c -     Microalbumin / creatinine urine ratio -     Ambulatory referral to Ophthalmology -     atorvastatin (LIPITOR) 10 MG tablet; Take 1 tablet (10 mg total) by mouth daily. Diabetes Mellitus, Type 2 : Controlled Continue medications. Keep blood sugar logs as instructed.   Hyperlipidemia associated with type 2 diabetes mellitus ([place]) -     Lipid panel Continue to work on low fat, heart healthy diet and participate in regular aerobic exercise program to control as well at least 150 minutes per week.   Essential hypertension -     amLODipine (NORVASC) 5 MG tablet; Take 1 tablet (5 mg total) by mouth daily. -     CBC -     Basic metabolic panel -     hydrochlorothiazide (HYDRODIURIL) 25 MG tablet; Take 1 tablet (25 mg total) by mouth daily. Continue all antihypertensives as prescribed.  Remember to bring in your blood pressure log with you for your follow up appointment.  DASH/Mediterranean Diets are healthier choices for HTN.    Colon cancer screening -     Ambulatory referral to Gastroenterology  Tobacco dependence  Encounter for tobacco use cessation counseling Tammy Beck was counseled on the dangers of tobacco use, and was advised to quit. Reviewed strategies to maximize success, including removing cigarettes and smoking materials from environment, stress management and support of family/friends as well as pharmacological alternatives including: Wellbutrin, Chantix, Nicotine patch, Nicotine gum or lozenges. Smoking cessation support: smoking cessation hotline: 1-800-QUIT-NOW.  Smoking cessation classes are also available through [facility]. Call [phone] or visit our website at [email or web address]   Spent  4 minutes counseling on smoking cessation and patient is not ready to quit.  Other orders -     triamcinolone ointment (KENALOG) 0.5 %; Apply 1 application topically 2 (two) times daily.    Patient has been counseled on age-appropriate routine health concerns for screening and prevention. These are reviewed and up-to-date. Referrals have been placed accordingly. Immunizations are up-to-date or declined.    Subjective:   Chief Complaint  Patient presents with  . Establish Care    Patient stated that her back hurts and may due to her bed. Patient stated that her knees hurt her and it may be due the weather. Patient stated she would like to get her throat and eye check. Patient stated she would like medication refills. Patient stated she have a rash under her right arm and its itchy.    HPI Tammy Beck 61 y.o. female presents to office today to establish care as a new patient. She has a history of DM, HTN and HPL. She has not been seen by primary care in over a year. She is in need of refills of her medication.    DM She spot checks her blood sugars. Last reading was 2 days ago with reading of low 100s. She has had diabetes education. She is knowledgeable of what "she should" be eating. Her Diabetes is diet controlled at this time.   Lab Results  Component Value Date   HGBA1C 5.7 08/03/2015     Essential Hypertension She just purchased an automated  blood pressure cuff today. Prior to  today she has not been checking her blood pressure. She endorses medication compliance with HCTZ. Her blood pressure has not been controlled <130/80 according to ADA guidelines. She denies chest pain, shortness of breath, palpitations, headaches or bilateral lower extremity edema. Still smoking. Has cut back to 4 cigarettes per day. Needs to work on losing weight as well as smoking cessation. She declines smoking cessation aids.  BP Readings from Last 3 Encounters:  12/27/16 (!) 146/91  08/03/15 139/86    05/09/15 (!) 138/93    Hyperlipidemia Controlled on lipitor 47m. She is not diet or exercise compliant. Denies myalgia.  Back Pain She has chronic low back pain and bilateral knee pain due to arthritis. 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 She reports not taking ibuprofen on the weekend. She is aware of the complications that can occur with taking ibuprofen long term.   URI Symptoms She reports recently starting to  work in a daycare and being around sick children all day. Her daughter was diagnosed with strep throat and she had another family member who had an ear infection. She has been experiencing a scratchy throat and itchy eyes and would like to make sure "she isn't coming down with something".   Review of Systems  Constitutional: Negative.   HENT: Positive for sore throat (scratchy throat).   Eyes:       Bilateral eye irritation  Respiratory: Negative.  Negative for cough.   Cardiovascular: Negative.  Negative for chest pain, orthopnea and leg swelling.  Musculoskeletal: Positive for back pain.  Skin: Positive for rash.  Neurological: Negative.  Negative for dizziness, tingling, sensory change and headaches.  Endo/Heme/Allergies: Positive for environmental allergies.  Psychiatric/Behavioral: Negative.  Negative for depression, hallucinations, memory loss and substance abuse. The patient is not nervous/anxious and does not have insomnia.     Past Medical History:  Diagnosis Date  . Diabetes mellitus without complication ([facility] [id number]   Past Surgical History:  Procedure Laterality Date  . KNEE SURGERY Right 2010   torn meniscus   . TUBAL LIGATION  1984    Family History  Problem Relation Age of Onset  . Colon cancer Brother 446 . Diabetes Mother   . Kidney disease Mother   . Hypertension Mother   . Kidney disease Son   . Lupus Son   . Kidney disease Brother   . Heart disease Brother   .  Diabetes Brother   . Congestive Heart Failure Brother     Social History Reviewed with no changes to be made today.   Outpatient Medications Prior to Visit  Medication Sig Dispense Refill  . Blood Glucose Monitoring Suppl (TRUE METRIX METER) w/Device KIT 1 each by Does not apply route as needed. 1 kit 0  . glucose blood (TRUE METRIX BLOOD GLUCOSE TEST) test strip 1 each by Other route 3 (three) times daily. 100 each 11  . TRUEPLUS LANCETS 28G MISC 1 each by Does not apply route 3 (three) times daily. 100 each 11  . atorvastatin (LIPITOR) 10 MG tablet Take 1 tablet (10 mg total) daily by mouth. 30 tablet 0  . hydrochlorothiazide (HYDRODIURIL) 25 MG tablet Take 1 tablet (25 mg total) daily by mouth. 30 tablet 0  . loratadine (CLARITIN) 10 MG tablet Take 1 tablet (10 mg total) by mouth daily. (Patient not taking: Reported on 12/27/2016) 30 tablet 11  . omeprazole (PRILOSEC) 40 MG capsule Take  40 mg by mouth 2 (two) times daily before a meal.     No facility-administered medications prior to visit.     No Known Allergies     Objective:    BP (!) 146/91 (BP Location: Left Arm, Patient Position: Sitting, Cuff Size: Normal)   Pulse 88   Temp 99.6 F (37.6 C) (Oral)   Ht '5\' 5"'  (1.651 m)   Wt 179 lb 9.6 oz (81.5 kg)   SpO2 98%   BMI 29.89 kg/m  Wt Readings from Last 3 Encounters:  12/27/16 179 lb 9.6 oz (81.5 kg)  08/03/15 167 lb 3.2 oz (75.8 kg)  05/09/15 172 lb (78 kg)    Physical Exam  Constitutional: She is oriented to person, place, and time. She appears well-developed and well-nourished. She is cooperative.  HENT:  Head: Normocephalic and atraumatic.  Eyes: EOM are normal.  Neck: Normal range of motion.  Cardiovascular: Normal rate, regular rhythm, normal heart sounds and intact distal pulses. Exam reveals no gallop and no friction rub.  No murmur heard. Pulmonary/Chest: Effort normal and breath sounds normal. No tachypnea. No respiratory distress. She has no decreased  breath sounds. She has no wheezes. She has no rhonchi. She has no rales. She exhibits no tenderness.  Abdominal: Soft. Bowel sounds are normal.  Musculoskeletal: Normal range of motion. She exhibits no edema.  Neurological: She is alert and oriented to person, place, and time. Coordination normal.  Skin: Skin is warm and dry. Rash noted. Rash is macular.     Eczematous appearing rash   Psychiatric: She has a normal mood and affect. Her behavior is normal. Judgment and thought content normal.  Nursing note and vitals reviewed.     Patient has been counseled extensively about nutrition and exercise as well as the importance of adherence with medications and regular follow-up. The patient was given clear instructions to go to ER or return to medical center if symptoms don't improve, worsen or new problems develop. The patient verbalized understanding.   Follow-up: Return in about 3 months (around 03/27/2017) for HTN/HPL/DM.   Gildardo Pounds, FNP-BC [facility] [place], [place]   01/01/2017, 11:26 PM

## 2016-12-28 LAB — MICROALBUMIN / CREATININE URINE RATIO
CREATININE, UR: 99.5 mg/dL
Microalb/Creat Ratio: 7.2 mg/g creat (ref 0.0–30.0)
Microalbumin, Urine: 7.2 ug/mL

## 2016-12-28 LAB — BASIC METABOLIC PANEL
BUN / CREAT RATIO: 16 (ref 9–23)
BUN: 14 mg/dL (ref 6–24)
CHLORIDE: 104 mmol/L (ref 96–106)
CO2: 26 mmol/L (ref 20–29)
CREATININE: 0.86 mg/dL (ref 0.57–1.00)
Calcium: 9.8 mg/dL (ref 8.7–10.2)
GFR calc Af Amer: 86 mL/min/{1.73_m2} (ref >59)
GFR calc non Af Amer: 74 mL/min/{1.73_m2} (ref >59)
GLUCOSE: 98 mg/dL (ref 65–99)
Potassium: 3.9 mmol/L (ref 3.5–5.2)
SODIUM: 144 mmol/L (ref 134–144)

## 2016-12-28 LAB — LIPID PANEL
CHOL/HDL RATIO: 3.4 ratio (ref 0.0–4.4)
Cholesterol, Total: 200 mg/dL — ABNORMAL HIGH (ref 100–199)
HDL: 59 mg/dL (ref >39)
LDL CALC: 116 mg/dL — AB (ref 0–99)
Triglycerides: 125 mg/dL (ref 0–149)
VLDL CHOLESTEROL CAL: 25 mg/dL (ref 5–40)

## 2016-12-28 LAB — CBC
HEMOGLOBIN: 12.9 g/dL (ref 11.1–15.9)
Hematocrit: 39.7 % (ref 34.0–46.6)
MCH: 29.2 pg (ref 26.6–33.0)
MCHC: 32.5 g/dL (ref 31.5–35.7)
MCV: 90 fL (ref 79–97)
PLATELETS: 261 10*3/uL (ref 150–379)
RBC: 4.42 x10E6/uL (ref 3.77–5.28)
RDW: 14.1 % (ref 12.3–15.4)
WBC: 10.7 10*3/uL (ref 3.4–10.8)

## 2017-01-01 ENCOUNTER — Encounter: Payer: Self-pay | Admitting: Nurse Practitioner

## 2017-01-01 DIAGNOSIS — F17200 Nicotine dependence, unspecified, uncomplicated: Secondary | ICD-10-CM | POA: Insufficient documentation

## 2017-01-02 ENCOUNTER — Telehealth: Payer: Self-pay

## 2017-01-02 NOTE — Progress Notes (Signed)
Patient would like to know if she have to take both Amlodipine and HTZ for her high blood pressure. Patient stated that Amlodipine makes her very tired. Please advised.

## 2017-01-02 NOTE — Telephone Encounter (Signed)
-----   Message from Gildardo Pounds, NP sent at 01/01/2017 11:31 PM EST ----- Please call patient: Tests show increased cholesterol/lipid levels and LDL. Make sure you are taking your lipitor every evening before bed.  Patient should continue to work on low fat, heart healthy diet and participate in regular aerobic exercise program to control as well for at least 150 minutes per week.

## 2017-01-02 NOTE — Telephone Encounter (Signed)
Patient informed on lab result. Patient aware to eat healthy diet and exercise.  Patient verified DOB.

## 2017-01-30 ENCOUNTER — Encounter: Payer: Self-pay | Admitting: Nurse Practitioner

## 2017-02-05 ENCOUNTER — Telehealth: Payer: Self-pay

## 2017-02-05 NOTE — Telephone Encounter (Signed)
-----   Message from Gildardo Pounds, NP sent at 02/03/2017 11:36 PM EST ----- Please make sure patient is still taking amlodipine and HCTZ. Thank you.   ----- Message ----- From: Mariane Baumgarten, [place] Sent: 01/02/2017  10:24 AM To: Gildardo Pounds, NP  Patient would like to know if she have to take both Amlodipine and HTZ for her high blood pressure. Patient stated that Amlodipine makes her very tired. Please advised.

## 2017-02-05 NOTE — Telephone Encounter (Signed)
CMA attempt to call back patient to informed PCP advising, no answer.  CMA left a voicemail and callback number.

## 2017-02-06 MED FILL — ?AMLODIPINE BESYLATE 5 MG T: 5 MG | 30 days supply | Qty: 30 | Fill #1

## 2017-02-06 MED FILL — ?ATORVASTATIN 10 MG TABLET: 10 | 30 days supply | Qty: 30 | Fill #1

## 2017-02-06 MED FILL — HYDROCHLOROTHIAZIDE 25 MG T: 25 | 30 days supply | Qty: 30 | Fill #1

## 2017-02-18 NOTE — Telephone Encounter (Signed)
Patient calling back to have repeat colonoscopy scheduled. Reviewed colonoscopy reports were not scanned in. Records have been placed on Dr.Nandigam's desk again to be reviewed and advise as to scheduling repeat colonoscopy.

## 2017-02-18 NOTE — Telephone Encounter (Signed)
Dr. Silverio Decamp reviewed previous colon report and okay to schedule direct colon.  LM on Vmail to call back to schedule

## 2017-03-21 NOTE — Telephone Encounter (Signed)
Left another message for patient to call back and schedule with Dr.Nandigam. Accepted records now placed in records file cabinet.

## 2017-03-27 ENCOUNTER — Other Ambulatory Visit: Payer: Self-pay | Admitting: Family Medicine

## 2017-03-27 DIAGNOSIS — Z1231 Encounter for screening mammogram for malignant neoplasm of breast: Secondary | ICD-10-CM

## 2017-03-28 ENCOUNTER — Ambulatory Visit: Payer: Self-pay | Admitting: Nurse Practitioner

## 2017-05-05 ENCOUNTER — Encounter: Payer: Self-pay | Admitting: Gastroenterology

## 2017-05-14 ENCOUNTER — Ambulatory Visit (AMBULATORY_SURGERY_CENTER): Payer: Self-pay | Admitting: *Deleted

## 2017-05-14 ENCOUNTER — Other Ambulatory Visit: Payer: Self-pay

## 2017-05-14 VITALS — Ht 65.0 in | Wt 167.0 lb

## 2017-05-14 DIAGNOSIS — Z800 Family history of malignant neoplasm of digestive organs: Secondary | ICD-10-CM

## 2017-05-14 DIAGNOSIS — Z86010 Personal history of colonic polyps: Secondary | ICD-10-CM

## 2017-05-14 MED ORDER — NA SULFATE-K SULFATE-MG SULF 17.5-3.13-1.6 GM/177ML PO SOLN
ORAL | 0 refills | Status: AC
Start: 2017-05-14 — End: ?

## 2017-05-14 NOTE — Progress Notes (Signed)
Patient denies any allergies to eggs or soy. Patient denies any problems with anesthesia/sedation. Patient denies any oxygen use at home. Patient denies taking any diet/weight loss medications or blood thinners. EMMI education declined by the pt.  

## 2017-05-19 ENCOUNTER — Telehealth: Payer: Self-pay | Admitting: Gastroenterology

## 2017-05-19 NOTE — Telephone Encounter (Signed)
Spoke with patient. She states the Suprep is over $100 even with her insurance. Pt denies having Medicaid. Coupon called in to patient's pharmacy for pay no more than $50. Pt is aware and understands that she will have to pay $50.

## 2017-05-28 ENCOUNTER — Ambulatory Visit
Admission: RE | Admit: 2017-05-28 | Discharge: 2017-05-28 | Disposition: A | Discharge status: Home / Self Care | Payer: PRIVATE HEALTH INSURANCE | Source: Ambulatory Visit | Attending: Family Medicine | Admitting: Family Medicine

## 2017-05-28 DIAGNOSIS — Z1231 Encounter for screening mammogram for malignant neoplasm of breast: Secondary | ICD-10-CM

## 2017-06-04 ENCOUNTER — Encounter: Payer: Self-pay | Admitting: Gastroenterology

## 2017-12-19 ENCOUNTER — Encounter (HOSPITAL_COMMUNITY): Payer: Self-pay

## 2017-12-19 ENCOUNTER — Emergency Department (HOSPITAL_COMMUNITY)
Admission: EM | Admit: 2017-12-19 | Discharge: 2017-12-19 | Disposition: A | Discharge status: Home / Self Care | Payer: PRIVATE HEALTH INSURANCE | Attending: Emergency Medicine | Admitting: Emergency Medicine

## 2017-12-19 ENCOUNTER — Emergency Department (HOSPITAL_COMMUNITY): Payer: PRIVATE HEALTH INSURANCE

## 2017-12-19 ENCOUNTER — Other Ambulatory Visit: Payer: Self-pay

## 2017-12-19 DIAGNOSIS — F17210 Nicotine dependence, cigarettes, uncomplicated: Secondary | ICD-10-CM | POA: Insufficient documentation

## 2017-12-19 DIAGNOSIS — M25562 Pain in left knee: Secondary | ICD-10-CM | POA: Diagnosis present

## 2017-12-19 DIAGNOSIS — I10 Essential (primary) hypertension: Secondary | ICD-10-CM | POA: Diagnosis not present

## 2017-12-19 DIAGNOSIS — Y9301 Activity, walking, marching and hiking: Secondary | ICD-10-CM | POA: Diagnosis not present

## 2017-12-19 DIAGNOSIS — Y999 Unspecified external cause status: Secondary | ICD-10-CM | POA: Diagnosis not present

## 2017-12-19 DIAGNOSIS — E119 Type 2 diabetes mellitus without complications: Secondary | ICD-10-CM | POA: Insufficient documentation

## 2017-12-19 DIAGNOSIS — Z7982 Long term (current) use of aspirin: Secondary | ICD-10-CM | POA: Diagnosis not present

## 2017-12-19 DIAGNOSIS — Y929 Unspecified place or not applicable: Secondary | ICD-10-CM | POA: Insufficient documentation

## 2017-12-19 DIAGNOSIS — X500XXA Overexertion from strenuous movement or load, initial encounter: Secondary | ICD-10-CM | POA: Insufficient documentation

## 2017-12-19 DIAGNOSIS — Z79899 Other long term (current) drug therapy: Secondary | ICD-10-CM | POA: Diagnosis not present

## 2017-12-19 DIAGNOSIS — M25462 Effusion, left knee: Secondary | ICD-10-CM | POA: Diagnosis not present

## 2017-12-19 NOTE — ED Triage Notes (Signed)
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 Pt states that she "heard a pop" and almost fell to the ground.  Pt states she tried OTC meds without relief. Pt states she was "dragging my leg" yesterday, but has been a little better today.

## 2017-12-19 NOTE — ED Provider Notes (Signed)
[facility] DEPT Provider Note   CSN: [id number] Arrival date & time: 12/19/17  1750     History   Chief Complaint Chief Complaint  Patient presents with  . Knee Pain    left    HPI Tammy Beck is a 62 y.o. female.  HPI 61 year old female presents with left knee pain.  Patient states symptoms started suddenly yesterday when she hyperextended her left knee while walking.  She states she heard a "click" and then had instant pain anterior aspect of her knee.  She states pain is almost completely resolved but she did not go to work today and needs a work note.  She denies any numbness, tingling, difficulty ambulating, decreased range of motion.  She denies any other injuries at this time.   Past Medical History:  Diagnosis Date  . Anxiety   . Depression   . Diabetes mellitus without complication ([place]) 15/17/6160   pre-diabetic-diet controlled per pt  . Hyperlipidemia   . Hypertension   . Positive PPD, treated 20 years ago    Patient Active Problem List   Diagnosis Date Noted  . Tobacco dependence 01/01/2017  . Essential hypertension 12/27/2016  . GERD (gastroesophageal reflux disease) 08/03/2015  . Laryngopharyngeal reflux (LPR) 07/11/2015  . 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 Supraclavicular fossa fullness 04/02/2015  . Hyperlipidemia associated with type 2 diabetes mellitus ([place]) 01/13/2015  . Vitamin D deficiency 01/13/2015  . Diabetes type 2, controlled ([place]) 01/13/2015  . Lipoma of back 01/13/2015  . Mass of axilla 01/13/2015    Past Surgical History:  Procedure Laterality Date  . COLONOSCOPY    . KNEE SURGERY Right 2010   torn meniscus   . TUBAL LIGATION  1984     OB History    Gravida  3   Para  3   Term  3   Preterm      AB      Living  3     SAB      TAB      Ectopic      Multiple      Live Births               Home Medications    Prior to Admission medications   Medication Sig Start Date End  Date Taking? Authorizing Provider  amLODipine (NORVASC) 5 MG tablet Take 1 tablet (5 mg total) by mouth daily. 12/27/16   Gildardo Pounds, NP  aspirin EC 81 MG tablet Take 81 mg by mouth daily.    [provider]  atorvastatin (LIPITOR) 10 MG tablet Take 1 tablet (10 mg total) by mouth daily. 12/27/16 05/14/17  Gildardo Pounds, NP  Blood Glucose Monitoring Suppl (TRUE METRIX METER) w/Device KIT 1 each by Does not apply route as needed. 01/13/15   Funches, Adriana Mccallum, MD  glucose blood (TRUE METRIX BLOOD GLUCOSE TEST) test strip 1 each by Other route 3 (three) times daily. 01/13/15   Funches, Adriana Mccallum, MD  hydrochlorothiazide (HYDRODIURIL) 25 MG tablet Take 1 tablet (25 mg total) by mouth daily. 12/27/16 05/14/17  Gildardo Pounds, NP  ibuprofen (ADVIL,MOTRIN) 200 MG tablet Take 800 mg by mouth every 6 (six) hours as needed.    [provider]  loratadine (CLARITIN) 10 MG tablet Take 1 tablet (10 mg total) by mouth daily. Patient not taking: Reported on 12/27/2016 05/09/15   Boykin Nearing, MD  Na Sulfate-K Sulfate-Mg Sulf 17.5-3.13-1.6 GM/177ML SOLN Suprep (no substitutions)-TAKE AS DIRECTED. 05/14/17  Mauri Pole, MD  Probiotic Product (PROBIOTIC PO) Take 1 capsule by mouth daily.    [provider]  triamcinolone ointment (KENALOG) 0.5 % Apply 1 application topically 2 (two) times daily. 12/27/16   Gildardo Pounds, NP  TRUEPLUS LANCETS 28G MISC 1 each by Does not apply route 3 (three) times daily. 01/13/15   Boykin Nearing, MD    Family History Family History  Problem Relation Age of Onset  . Colon cancer Brother 68  . Diabetes Mother   . Kidney disease Mother   . Hypertension Mother   . Kidney disease Son   . Lupus Son   . Kidney disease Brother   . Heart disease Brother   . Diabetes Brother   . Congestive Heart Failure Brother   . Breast cancer Neg Hx     Social History Social History   Tobacco Use  . Smoking status: Current Every Day Smoker     Packs/day: 0.25    Types: Cigarettes  . Smokeless tobacco: Never Used  Substance Use Topics  . Alcohol use: Yes    Alcohol/week: 7.0 standard drinks    Types: 7 Glasses of wine per week  . Drug use: No     Allergies   Patient has no known allergies.   Review of Systems Review of Systems  Constitutional: Negative for chills and fever.  Respiratory: Negative for shortness of breath.   Cardiovascular: Negative for chest pain.  Gastrointestinal: Negative for abdominal pain, nausea and vomiting.  Musculoskeletal: Positive for arthralgias (left knee pain). Negative for gait problem.  Skin: Negative for wound.  Neurological: Negative for numbness.     Physical Exam Updated Vital Signs BP 136/83 (BP Location: Left Arm)   Pulse 100   Temp 98.6 F (37 C) (Oral)   Resp 18   Ht '5\' 5"'$  (1.651 m)   Wt 77.1 kg   SpO2 100%   BMI 28.29 kg/m   Physical Exam  Constitutional: She is oriented to person, place, and time. She appears well-developed and well-nourished.  HENT:  Head: Normocephalic and atraumatic.  Eyes: Conjunctivae and EOM are normal.  Neck: Neck supple.  Cardiovascular: Normal rate, regular rhythm and normal heart sounds.  No murmur heard. Pulmonary/Chest: Effort normal and breath sounds normal. No respiratory distress. She has no wheezes. She has no rales.  Abdominal: Soft. Bowel sounds are normal. She exhibits no distension. There is no tenderness.  Musculoskeletal: Normal range of motion. She exhibits no tenderness or deformity.       Left knee: She exhibits normal range of motion, no ecchymosis, no deformity, no erythema and no MCL laxity. No tenderness found.  Left knee with full active range of motion, strength 5 out of 5, nontender to palpation, neurovascularly intact distally.  Neurological: She is alert and oriented to person, place, and time.  Skin: Skin is warm and dry. No rash noted. No erythema.  Psychiatric: She has a normal mood and affect. Her behavior  is normal.  Nursing note and vitals reviewed.    ED Treatments / Results  Labs (all labs ordered are listed, but only abnormal results are displayed) Labs Reviewed - No data to display  EKG None  Radiology Dg Knee Complete 4 Views Left  Result Date: 12/19/2017 CLINICAL DATA:  Knee pain after hearing a pop at work. EXAM: LEFT KNEE - COMPLETE 4+ VIEW COMPARISON:  None. FINDINGS: There is moderate medial femorotibial joint space narrowing with moderate joint effusion in the suprapatellar compartment. As  findings are likely degenerative in etiology. No acute fracture, joint dislocation or bone destruction is seen. Probable cartilaginous rest the proximal tibial diaphysis. IMPRESSION: Moderate medial femorotibial joint space narrowing with moderate suprapatellar joint effusion. No acute osseous abnormality. Electronically Signed   By: Ashley Royalty M.D.   On: 12/19/2017 18:34    Procedures Procedures (including critical care time)  Medications Ordered in ED Medications - No data to display   Initial Impression / Assessment and Plan / ED Course  I have reviewed the triage vital signs and the nursing notes.  Pertinent labs & imaging results that were available during my care of the patient were reviewed by me and considered in my medical decision making (see chart for details).     Patient resting comfortably in bed, no acute distress, nontoxic, non-lethargic.  Vital signs stable. She states her knee pain is significantly improved at this time.  She is moving it without difficulty, denies any numbness and tingling.  X-ray shows moderate joint effusion but no fractures or dislocations.  Will place patient in a knee sleeve encouraged symptom management with RICE.  Given strict return precautions.   At this time there does not appear to be any evidence of an acute emergency medical condition and the patient appears stable for discharge with appropriate outpatient follow up.Diagnosis was  discussed with patient who verbalizes understanding and is agreeable to discharge.   Final Clinical Impressions(s) / ED Diagnoses   Final diagnoses:  None    ED Discharge Orders    None       Rachel Moulds 12/19/17 2226    Julianne Rice, MD 12/19/17 2332

## 2017-12-19 NOTE — Discharge Instructions (Signed)
Take Tylenol or Motrin as needed for pain.  Ice affected knee elevated when possible.  Wear sleeve on left knee for comfort.  If you are having persistent pain in 1 to 2 weeks follow-up with the orthopedic doctor.  Return to the ED immediately for new or worsening symptoms, such as new or worsening pain, swelling, redness, decreased range of motion, numbness or any concerns at all.

## 2018-08-28 MED FILL — HYDROCHLOROTHIAZIDE 25 MG T: 25 | 90 days supply | Qty: 90 | Fill #0

## 2018-08-28 MED FILL — AMLODIPINE BESYLATE 5 MG TA: 5 | 90 days supply | Qty: 90 | Fill #0

## 2018-09-04 MED FILL — metFORMIN HCL 500 MG TABS: 500 | 30 days supply | Qty: 60 | Fill #0

## 2018-09-14 ENCOUNTER — Other Ambulatory Visit: Payer: Self-pay | Admitting: Family Medicine

## 2018-09-14 DIAGNOSIS — Z1231 Encounter for screening mammogram for malignant neoplasm of breast: Secondary | ICD-10-CM

## 2018-09-17 MED FILL — ONE TOUCH VERIO TEST STRIP: 50 days supply | Qty: 100 | Fill #0

## 2018-09-17 MED FILL — ONETOUCH VERIO METER SYSTEM: W/DEVICE | 30 days supply | Qty: 1 | Fill #0

## 2018-09-17 MED FILL — ONETOUCH DELICA PLUS LANCET: 50 days supply | Qty: 100 | Fill #0

## 2018-10-14 MED FILL — OMEPRAZOLE 20 MG CAP: 20 | 30 days supply | Qty: 60 | Fill #0

## 2018-10-29 ENCOUNTER — Ambulatory Visit: Payer: PRIVATE HEALTH INSURANCE

## 2018-11-11 MED FILL — hydrOXYzine HCL 25 MG TABS: 25 | 30 days supply | Qty: 90 | Fill #0

## 2018-11-11 MED FILL — OMEPRAZOLE 20 MG CAP: 20 | 30 days supply | Qty: 30 | Fill #0

## 2019-03-25 MED FILL — ATORVASTATIN CALCIUM 20 MG: 20 | 90 days supply | Qty: 90 | Fill #0

## 2020-01-31 ENCOUNTER — Other Ambulatory Visit: Payer: Self-pay | Admitting: Nurse Practitioner

## 2020-01-31 ENCOUNTER — Encounter: Payer: Self-pay | Admitting: Nurse Practitioner

## 2020-01-31 ENCOUNTER — Other Ambulatory Visit: Payer: Self-pay

## 2020-01-31 ENCOUNTER — Ambulatory Visit: Payer: PRIVATE HEALTH INSURANCE | Attending: Nurse Practitioner | Admitting: Nurse Practitioner

## 2020-01-31 VITALS — Ht 65.0 in | Wt 169.0 lb

## 2020-01-31 DIAGNOSIS — K219 Gastro-esophageal reflux disease without esophagitis: Secondary | ICD-10-CM

## 2020-01-31 DIAGNOSIS — F32A Depression, unspecified: Secondary | ICD-10-CM

## 2020-01-31 DIAGNOSIS — Z1211 Encounter for screening for malignant neoplasm of colon: Secondary | ICD-10-CM

## 2020-01-31 DIAGNOSIS — I10 Essential (primary) hypertension: Secondary | ICD-10-CM

## 2020-01-31 DIAGNOSIS — E559 Vitamin D deficiency, unspecified: Secondary | ICD-10-CM

## 2020-01-31 DIAGNOSIS — Z1231 Encounter for screening mammogram for malignant neoplasm of breast: Secondary | ICD-10-CM

## 2020-01-31 DIAGNOSIS — E119 Type 2 diabetes mellitus without complications: Secondary | ICD-10-CM

## 2020-01-31 DIAGNOSIS — F419 Anxiety disorder, unspecified: Secondary | ICD-10-CM

## 2020-01-31 DIAGNOSIS — E785 Hyperlipidemia, unspecified: Secondary | ICD-10-CM

## 2020-01-31 DIAGNOSIS — Z7689 Persons encountering health services in other specified circumstances: Secondary | ICD-10-CM

## 2020-01-31 MED ORDER — TRUEPLUS LANCETS 28G MISC: 1.0000 | Freq: Three times a day (TID) | 11 refills | Status: DC

## 2020-01-31 MED ORDER — TRUE METRIX BLOOD GLUCOSE TEST VI STRP: 1.0000 | ORAL_STRIP | Freq: Every day | 11 refills | Status: AC

## 2020-01-31 MED ORDER — TRUE METRIX METER W/DEVICE KIT: 1.0000 | PACK | 0 refills | Status: AC | PRN

## 2020-01-31 MED ORDER — HYDROCHLOROTHIAZIDE 25 MG PO TABS: 25.0000 mg | ORAL_TABLET | Freq: Every day | ORAL | 1 refills | Status: DC

## 2020-01-31 MED ORDER — TRUEPLUS LANCETS 28G MISC: 11 refills | Status: AC

## 2020-01-31 MED ORDER — HYDROXYZINE HCL 25 MG PO TABS
12.5000 mg | ORAL_TABLET | Freq: Three times a day (TID) | ORAL | 1 refills | Status: AC | PRN
Start: 2020-01-31 — End: 2020-03-01

## 2020-01-31 MED ORDER — AMLODIPINE BESYLATE 5 MG PO TABS
5.0000 mg | ORAL_TABLET | Freq: Every day | ORAL | 0 refills | Status: DC
Start: 2020-01-31 — End: 2020-12-15

## 2020-01-31 MED ORDER — OMEPRAZOLE 20 MG PO CPDR: 20.0000 mg | DELAYED_RELEASE_CAPSULE | Freq: Every day | ORAL | 0 refills | Status: AC

## 2020-01-31 MED ORDER — ATORVASTATIN CALCIUM 20 MG PO TABS: 20.0000 mg | ORAL_TABLET | Freq: Every day | ORAL | 3 refills | Status: AC

## 2020-01-31 MED FILL — ATORVASTATIN CALCIUM 20 MG: 20 | 30 days supply | Qty: 30 | Fill #0

## 2020-01-31 MED FILL — AMLODIPINE BESYLATE 5 MG TA: 5 | 30 days supply | Qty: 30 | Fill #0

## 2020-01-31 MED FILL — TRUE METRIX GLUCOSE TEST ST: 50 days supply | Qty: 50 | Fill #0

## 2020-01-31 MED FILL — HYDROCHLOROTHIAZIDE 25 MG T: 25 | 30 days supply | Qty: 30 | Fill #0

## 2020-01-31 MED FILL — TRUEplus LANCETS 28G MISC: 90 days supply | Qty: 100 | Fill #0

## 2020-01-31 MED FILL — OMEPRAZOLE 20 MG CAP: 20 | 30 days supply | Qty: 30 | Fill #0

## 2020-01-31 MED FILL — !TRUE METRIX BLOOD GLUCOSE: 30 days supply | Qty: 1 | Fill #0

## 2020-01-31 MED FILL — hydrOXYzine HCL 25 MG TABS: 25 | 20 days supply | Qty: 60 | Fill #0

## 2020-01-31 NOTE — Progress Notes (Signed)
Virtual Visit via Telephone Note Due to national recommendations of social distancing due to [place] 19, telehealth visit is felt to be most appropriate for this patient at this time.  I discussed the limitations, risks, security and privacy concerns of performing an evaluation and management service by telephone and the availability of in person appointments. I also discussed with the patient that there may be a patient responsible charge related to this service. The patient expressed understanding and agreed to proceed.    I connected with Tammy Beck on 01/31/20  at   8:50 AM EST  EDT by telephone and verified that I am speaking with the correct person using two identifiers.   Consent I discussed the limitations, risks, security and privacy concerns of performing an evaluation and management service by telephone and the availability of in person appointments. I also discussed with the patient that there may be a patient responsible charge related to this service. The patient expressed understanding and agreed to proceed.   Location of Patient: Private Residence   Location of Tammy Beck: [place] and [employer] Office    Persons participating in Telemedicine visit: Tammy Rankins FNP-BC [place]    History of Present Illness: Telemedicine visit for: Establish care She has a history of HTN, prediabetes, HPL, GERD, GAD and Vit D deficiency She was a previous patient here at the community clinic a few years ago however she moved away and was closer to her previous clinic where she is no longer a patient.  Now reestablishing care  Patient has been counseled on age-appropriate routine health concerns for screening and prevention. These are reviewed and up-to-date. Referrals have been placed accordingly. Immunizations are up-to-date or declined.    Mammogram: Reports never having had a mammogram. She was referred to the breast clinic today.  Smoker She has cut  back significantly. She does endorse intermittent shortness of breath with exertion or walking long distances. Symptoms are not constant or occurring frequently. Smoking 2 to 3 cigarettes a day at this time and desires to quit. She does take Bayer asa 81 mg daily.   Vitamin D deficiency  Currently taking Vitamin D 1000 units OTC daily  ESSENTIAL HYPERTENSION She does not monitor her blood pressure at home. Stating she only checks it when she thinks it is high.  Currently taking amlodipine 5 mg daily and hydrochlorothiazide 25 mg daily as prescribed. Denies chest pain, shortness of breath, palpitations, lightheadedness, dizziness, headaches or BLE edema.  BP Readings from Last 3 Encounters:  12/19/17 136/83  12/27/16 (!) 146/91  08/03/15 139/86   DM 2 Highest A1c 7.1 in 2016. Last A1c 6.0 ( 03-18-2019). She only checks her blood glucose levels at home when she is feeling "weird". Currently diet controlled.  History of anxiety and depression She is currently prescribed hydroxyzine 25 mg 3 times daily as needed. She does not take this 3 times a day as it tends to make her drowsy. I have instructed her that she can take half a tablet instead of a day to help with symptoms of anxiety without causing drowsiness. She denies any current thoughts of self-harm. Depression screen [facility] 2/9 01/31/2020 12/27/2016 08/03/2015 05/09/2015 03/31/2015  Decreased Interest 0 0 0 0 0  Down, Depressed, Hopeless 0 0 0 0 0  PHQ - 2 Score 0 0 0 0 0  Altered sleeping 0 1 - 2 1  Tired, decreased energy 1 1 - 1 0  Change in appetite 0 0 - 0  0  Feeling bad or failure about yourself  0 0 - 0 0  Trouble concentrating 0 0 - 0 0  Moving slowly or fidgety/restless 0 0 - 0 0  Suicidal thoughts 0 0 - 0 0  PHQ-9 Score 1 2 - 3 1   GAD 7 : Generalized Anxiety Score 01/31/2020 12/27/2016 08/03/2015 05/09/2015  Nervous, Anxious, on Edge 1 1 0 -  Control/stop worrying 0 1 0 1  Worry too much - different things 0 1 0 1  Trouble  relaxing 0 1 0 1  Restless 0 0 0 0  Easily annoyed or irritable 0 0 0 1  Afraid - awful might happen 0 0 0 1  Total GAD 7 Score 1 4 0 -  Anxiety Difficulty - - Not difficult at all -     Past Medical History:  Diagnosis Date  . Anxiety   . Depression   . Hyperlipidemia   . Hypertension   . Positive PPD, treated 20 years ago    Past Surgical History:  Procedure Laterality Date  . COLONOSCOPY    . KNEE SURGERY Right 2010   torn meniscus   . TUBAL LIGATION  1984    Family History  Problem Relation Age of Onset  . Colon cancer Brother 59  . Diabetes Mother   . Kidney disease Mother   . Hypertension Mother   . Kidney disease Son   . Lupus Son   . Kidney disease Brother   . Heart disease Brother   . Diabetes Brother   . Congestive Heart Failure Brother   . Breast cancer Neg Hx     Social History   Socioeconomic History  . Marital status: Single    Spouse name: Not on file  . Number of children: 3  . Years of education: 76  . Highest education level: Not on file  Occupational History  . Occupation: Unemployed  Tobacco Use  . Smoking status: Current Every Day Smoker    Packs/day: 0.25    Types: Cigarettes  . Smokeless tobacco: Never Used  Vaping Use  . Vaping Use: Never used  Substance and Sexual Activity  . Alcohol use: Yes    Alcohol/week: 7.0 standard drinks    Types: 7 Glasses of wine per week  . Drug use: No  . Sexual activity: Not Currently  Other Topics Concern  . Not on file  Social History Narrative   Lives with son, middle child and 1 grandchild    Social Determinants of Health   Financial Resource Strain: Not on file  Food Insecurity: Not on file  Transportation Needs: Not on file  Physical Activity: Not on file  Stress: Not on file  Social Connections: Not on file     Observations/Objective: Awake, alert and oriented x 3   Review of Systems  Constitutional: Negative for fever, malaise/fatigue and weight loss.  HENT: Negative.   Negative for nosebleeds.   Eyes: Negative.  Negative for blurred vision, double vision and photophobia.  Respiratory: Negative.  Negative for cough and shortness of breath.   Cardiovascular: Negative.  Negative for chest pain, palpitations and leg swelling.  Gastrointestinal: Positive for heartburn. Negative for nausea and vomiting.  Musculoskeletal: Negative.  Negative for myalgias.  Neurological: Negative.  Negative for dizziness, focal weakness, seizures and headaches.  Psychiatric/Behavioral: Negative for suicidal ideas. The patient is nervous/anxious and has insomnia.     Assessment and Plan: Shaima was seen today for establish care.  Diagnoses and all  orders for this visit:  Encounter to establish care  Essential hypertension -     hydrochlorothiazide (HYDRODIURIL) 25 MG tablet; Take 1 tablet (25 mg total) by mouth daily. -     amLODipine (NORVASC) 5 MG tablet; Take 1 tablet (5 mg total) by mouth daily. -     CMP14+EGFR; Future Continue blood sugar control as discussed in office today, low carbohydrate diet, and regular physical exercise as tolerated, 150 minutes per week (30 min each day, 5 days per week, or 50 min 3 days per week). Keep blood sugar logs with fasting goal of 90-130 mg/dl, post prandial (after you eat) less than 180.  For Hypoglycemia: BS <60 and Hyperglycemia BS >400; contact the clinic ASAP. Annual eye exams and foot exams are recommended.   Type 2 diabetes mellitus without complication, without long-term current use of insulin (HCC) -     glucose blood (TRUE METRIX BLOOD GLUCOSE TEST) test strip; 1 each by Other route daily. Use as instructed. Check blood glucose level by fingerstick once per day. -     Blood Glucose Monitoring Suppl (TRUE METRIX METER) w/Device KIT; 1 each by Does not apply route as needed. -     TRUEplus Lancets 28G MISC; 1 each by Does not apply route 3 (three) times daily. Use as instructed. Check blood glucose level by fingerstick once per  day. -     Hemoglobin A1c; Future -     TSH; Future Continue blood sugar control as discussed in office today, low carbohydrate diet, and regular physical exercise as tolerated, 150 minutes per week (30 min each day, 5 days per week, or 50 min 3 days per week). Keep blood sugar logs with fasting goal of 90-130 mg/dl, post prandial (after you eat) less than 180.  For Hypoglycemia: BS <60 and Hyperglycemia BS >400; contact the clinic ASAP. Annual eye exams and foot exams are recommended.   Vitamin D deficiency -     VITAMIN D 25 Hydroxy (Vit-D Deficiency, Fractures); Future  Breast cancer screening by mammogram -     MM 3D SCREEN BREAST BILATERAL; Future  Dyslipidemia, goal LDL below 100 -     atorvastatin (LIPITOR) 20 MG tablet; Take 1 tablet (20 mg total) by mouth daily. -     Lipid panel; Future INSTRUCTIONS: Work on a low fat, heart healthy diet and participate in regular aerobic exercise program by working out at least 150 minutes per week; 5 days a week-30 minutes per day. Avoid red meat/beef/steak,  fried foods. junk foods, sodas, sugary drinks, unhealthy snacking, alcohol and smoking.  Drink at least 80 oz of water per day and monitor your carbohydrate intake daily.     Anxiety and depression -     hydrOXYzine (ATARAX/VISTARIL) 25 MG tablet; Take 0.5-1 tablets (12.5-25 mg total) by mouth every 8 (eight) hours as needed.  Gastroesophageal reflux disease without esophagitis -     omeprazole (PRILOSEC) 20 MG capsule; Take 1 capsule (20 mg total) by mouth daily. -     CBC; Future INSTRUCTIONS: Avoid GERD Triggers: acidic, spicy or fried foods, caffeine, coffee, sodas,  alcohol and chocolate.   Colon cancer screening -     Fecal occult blood, imunochemical; Future     Follow Up Instructions Return in about 3 months (around 04/30/2020).     I discussed the assessment and treatment plan with the patient. The patient was provided an opportunity to ask questions and all were  answered. The patient agreed with the  plan and demonstrated an understanding of the instructions.   The patient was advised to call back or seek an in-person evaluation if the symptoms worsen or if the condition fails to improve as anticipated.  I provided 22 minutes of non-face-to-face time during this encounter including median intraservice time, reviewing previous notes, labs, imaging, medications and explaining diagnosis and management.  Gildardo Pounds, FNP-BC

## 2020-02-01 ENCOUNTER — Telehealth: Payer: Self-pay | Admitting: Nurse Practitioner

## 2020-02-01 NOTE — Telephone Encounter (Signed)
Pt received pfizer vaccine 1st dose on 07-02-2019 and 2nd dose 07-23-2019. Pt has the phone number [phone] to call to schedule the booster when she is ready

## 2020-02-11 ENCOUNTER — Ambulatory Visit: Payer: Self-pay | Attending: Nurse Practitioner

## 2020-02-11 ENCOUNTER — Other Ambulatory Visit: Payer: Self-pay

## 2020-02-11 DIAGNOSIS — E119 Type 2 diabetes mellitus without complications: Secondary | ICD-10-CM

## 2020-02-11 DIAGNOSIS — I10 Essential (primary) hypertension: Secondary | ICD-10-CM

## 2020-02-11 DIAGNOSIS — E559 Vitamin D deficiency, unspecified: Secondary | ICD-10-CM

## 2020-02-11 DIAGNOSIS — Z1211 Encounter for screening for malignant neoplasm of colon: Secondary | ICD-10-CM

## 2020-02-11 DIAGNOSIS — K219 Gastro-esophageal reflux disease without esophagitis: Secondary | ICD-10-CM

## 2020-02-11 DIAGNOSIS — E785 Hyperlipidemia, unspecified: Secondary | ICD-10-CM

## 2020-02-12 LAB — CMP14+EGFR
ALT: 23 [IU]/L (ref 0–32)
AST: 22 [IU]/L (ref 0–40)
Albumin/Globulin Ratio: 1.7 (ref 1.2–2.2)
Albumin: 4.8 g/dL (ref 3.8–4.8)
Alkaline Phosphatase: 104 [IU]/L (ref 44–121)
BUN/Creatinine Ratio: 16 (ref 12–28)
BUN: 15 mg/dL (ref 8–27)
Bilirubin Total: 0.5 mg/dL (ref 0.0–1.2)
CO2: 24 mmol/L (ref 20–29)
Calcium: 9.9 mg/dL (ref 8.7–10.3)
Chloride: 101 mmol/L (ref 96–106)
Creatinine, Ser: 0.95 mg/dL (ref 0.57–1.00)
GFR calc Af Amer: 74 mL/min/{1.73_m2}
GFR calc non Af Amer: 64 mL/min/{1.73_m2}
Globulin, Total: 2.8 g/dL (ref 1.5–4.5)
Glucose: 107 mg/dL — ABNORMAL HIGH (ref 65–99)
Potassium: 4 mmol/L (ref 3.5–5.2)
Sodium: 142 mmol/L (ref 134–144)
Total Protein: 7.6 g/dL (ref 6.0–8.5)

## 2020-02-12 LAB — CBC
Hematocrit: 39.4 % (ref 34.0–46.6)
Hemoglobin: 13.5 g/dL (ref 11.1–15.9)
MCH: 30.3 pg (ref 26.6–33.0)
MCHC: 34.3 g/dL (ref 31.5–35.7)
MCV: 88 fL (ref 79–97)
Platelets: 231 10*3/uL (ref 150–450)
RBC: 4.46 x10E6/uL (ref 3.77–5.28)
RDW: 12.6 % (ref 11.7–15.4)
WBC: 8.3 10*3/uL (ref 3.4–10.8)

## 2020-02-12 LAB — MICROALBUMIN / CREATININE URINE RATIO
Creatinine, Urine: 277.6 mg/dL
Microalb/Creat Ratio: 8 mg/g{creat} (ref 0–29)
Microalbumin, Urine: 21.2 ug/mL

## 2020-02-12 LAB — TSH: TSH: 2.72 u[IU]/mL (ref 0.450–4.500)

## 2020-02-12 LAB — LIPID PANEL
Chol/HDL Ratio: 3.3 ratio (ref 0.0–4.4)
Cholesterol, Total: 200 mg/dL — ABNORMAL HIGH (ref 100–199)
HDL: 60 mg/dL (ref >39)
LDL Chol Calc (NIH): 120 mg/dL — ABNORMAL HIGH (ref 0–99)
Triglycerides: 114 mg/dL (ref 0–149)
VLDL Cholesterol Cal: 20 mg/dL (ref 5–40)

## 2020-02-12 LAB — HEMOGLOBIN A1C
Est. average glucose Bld gHb Est-mCnc: 134 mg/dL
Hgb A1c MFr Bld: 6.3 % — ABNORMAL HIGH (ref 4.8–5.6)

## 2020-02-12 LAB — VITAMIN D 25 HYDROXY (VIT D DEFICIENCY, FRACTURES): Vit D, 25-Hydroxy: 36 ng/mL (ref 30.0–100.0)

## 2020-02-14 MED FILL — TRUE METRIX GLUCOSE TEST ST: 50 days supply | Qty: 50 | Fill #0

## 2020-02-14 MED FILL — !TRUE METRIX BLOOD GLUCOSE: 30 days supply | Qty: 1 | Fill #0

## 2020-02-14 MED FILL — HYDROCHLOROTHIAZIDE 25 MG T: 25 | 30 days supply | Qty: 30 | Fill #0

## 2020-02-14 MED FILL — ATORVASTATIN CALCIUM 20 MG: 20 | 30 days supply | Qty: 30 | Fill #0

## 2020-02-14 MED FILL — AMLODIPINE BESYLATE 5 MG TA: 5 | 30 days supply | Qty: 30 | Fill #0

## 2020-02-18 ENCOUNTER — Ambulatory Visit: Payer: Self-pay | Attending: Nurse Practitioner

## 2020-02-18 ENCOUNTER — Other Ambulatory Visit: Payer: Self-pay

## 2020-03-03 ENCOUNTER — Telehealth: Payer: Self-pay | Admitting: Nurse Practitioner

## 2020-03-03 NOTE — Telephone Encounter (Signed)
Copied from [place] [id number]. Topic: General - Other >> Mar 03, 2020 12:06 PM Celene Kras wrote: Reason for CRM: Pt called and is requesting to speak with Clifton James regarding the forms she was supposed to return. Please advise.

## 2020-03-06 NOTE — Telephone Encounter (Signed)
I return Pt call, spoke with Pt waiting on email from her to complete her [employer] application

## 2020-03-13 ENCOUNTER — Telehealth: Payer: Self-pay | Admitting: Nurse Practitioner

## 2020-03-13 NOTE — Telephone Encounter (Signed)
Return Pt call and was inform that I received the email

## 2020-03-13 NOTE — Telephone Encounter (Signed)
Pt would like you to call her concerning her taxes and application.

## 2020-03-14 ENCOUNTER — Ambulatory Visit: Payer: Self-pay | Attending: Nurse Practitioner | Admitting: Nurse Practitioner

## 2020-03-14 ENCOUNTER — Other Ambulatory Visit: Payer: Self-pay

## 2020-03-14 ENCOUNTER — Encounter: Payer: Self-pay | Admitting: Nurse Practitioner

## 2020-03-14 DIAGNOSIS — E119 Type 2 diabetes mellitus without complications: Secondary | ICD-10-CM

## 2020-03-14 DIAGNOSIS — I10 Essential (primary) hypertension: Secondary | ICD-10-CM

## 2020-03-14 DIAGNOSIS — E785 Hyperlipidemia, unspecified: Secondary | ICD-10-CM

## 2020-03-14 DIAGNOSIS — Z1211 Encounter for screening for malignant neoplasm of colon: Secondary | ICD-10-CM

## 2020-03-14 NOTE — Progress Notes (Signed)
Virtual Visit via Telephone Note Due to national recommendations of social distancing due to [place] 19, telehealth visit is felt to be most appropriate for this patient at this time.  I discussed the limitations, risks, security and privacy concerns of performing an evaluation and management service by telephone and the availability of in person appointments. I also discussed with the patient that there may be a patient responsible charge related to this service. The patient expressed understanding and agreed to proceed.    I connected with Tammy Beck on 03/14/20  at   2:30 PM EST  EDT by telephone and verified that I am speaking with the correct person using two identifiers.   Consent I discussed the limitations, risks, security and privacy concerns of performing an evaluation and management service by telephone and the availability of in person appointments. I also discussed with the patient that there may be a patient responsible charge related to this service. The patient expressed understanding and agreed to proceed.   Location of Patient: Private Residence   Location of Provider: [place] and [employer] Office    Persons participating in Telemedicine visit: [name] FNP-BC [place]    History of Present Illness: Telemedicine visit for: Follow up She has a past medical history of Anxiety, Depression, Hyperlipidemia, Hypertension, and Positive PPD, treated (20 years ago).  Patient has been counseled on age-appropriate routine health concerns for screening and prevention. These are reviewed and up-to-date. Referrals have been placed accordingly. Immunizations are up-to-date or declined.    PAP SMEAR: will schedule MAMMOGRAM: referred to [facility]   She is dealing "with a lot of anxiety". Her sister passed a month ago. She has been taking hydroxyzine 25 mg prn. Her son's death anniversary is coming up as well. She had lived with her son for 5 years  prior to his death.  Several of her siblings also have passed. Taking a half tablet of hydroxyzine instead of a whole tablet. She is reluctant to try another anxiolytic or SSRI. Declines today. She did see a therapist when she was living in [place].    Essential Hypertension Well controlled. Taking amlodipine 5 mg daily and HCTZ 25 mg daily. Denies chest pain, shortness of breath, palpitations, lightheadedness, dizziness, headaches or BLE edema. She does monitor her blood pressure at home with most recent reading: 112/85. BP Readings from Last 3 Encounters:  12/19/17 136/83  12/27/16 (!) 146/91  08/03/15 139/86    DM2 Well controlled with diet only at this time. LDL not at goal with atorvastatin 20 mg daily.  Lab Results  Component Value Date   HGBA1C 6.3 (H) 02/11/2020   Lab Results  Component Value Date   LDLCALC 120 (H) 02/11/2020     Past Medical History:  Diagnosis Date  . Anxiety   . Depression   . Hyperlipidemia   . Hypertension   . Positive PPD, treated 20 years ago    Past Surgical History:  Procedure Laterality Date  . COLONOSCOPY    . KNEE SURGERY Right 2010   torn meniscus   . TUBAL LIGATION  1984    Family History  Problem Relation Age of Onset  . Colon cancer Brother 92  . Diabetes Mother   . Kidney disease Mother   . Hypertension Mother   . Kidney disease Son   . Lupus Son   . Kidney disease Brother   . Heart disease Brother   . Diabetes Brother   . Congestive Heart Failure  Brother   . Breast cancer Neg Hx     Social History   Socioeconomic History  . Marital status: Single    Spouse name: Not on file  . Number of children: 3  . Years of education: 74  . Highest education level: Not on file  Occupational History  . Occupation: Unemployed  Tobacco Use  . Smoking status: Current Every Day Smoker    Packs/day: 0.25    Types: Cigarettes  . Smokeless tobacco: Never Used  Vaping Use  . Vaping Use: Never used  Substance and Sexual Activity  .  Alcohol use: Yes    Alcohol/week: 7.0 standard drinks    Types: 7 Glasses of wine per week  . Drug use: No  . Sexual activity: Not Currently  Other Topics Concern  . Not on file  Social History Narrative   Lives with son, middle child and 1 grandchild    Social Determinants of Health   Financial Resource Strain: Not on file  Food Insecurity: Not on file  Transportation Needs: Not on file  Physical Activity: Not on file  Stress: Not on file  Social Connections: Not on file     Observations/Objective: Awake, alert and oriented x 3   Review of Systems  Constitutional: Negative for fever, malaise/fatigue and weight loss.  HENT: Negative.  Negative for nosebleeds.   Eyes: Negative.  Negative for blurred vision, double vision and photophobia.  Respiratory: Negative.  Negative for cough and shortness of breath.   Cardiovascular: Negative.  Negative for chest pain, palpitations and leg swelling.  Gastrointestinal: Negative.  Negative for heartburn, nausea and vomiting.  Musculoskeletal: Negative.  Negative for myalgias.  Neurological: Negative.  Negative for dizziness, focal weakness, seizures and headaches.  Psychiatric/Behavioral: Negative for suicidal ideas. The patient is nervous/anxious.     Assessment and Plan: Diagnoses and all orders for this visit:  Type 2 diabetes mellitus without complication, without long-term current use of insulin ([place]) Continue blood sugar control as discussed in office today, low carbohydrate diet, and regular physical exercise as tolerated, 150 minutes per week (30 min each day, 5 days per week, or 50 min 3 days per week). Keep blood sugar logs with fasting goal of 90-130 mg/dl, post prandial (after you eat) less than 180.  For Hypoglycemia: BS <60 and Hyperglycemia BS >400; contact the clinic ASAP. Annual eye exams and foot exams are recommended.   Essential hypertension Continue all antihypertensives as prescribed.  Remember to bring in your  blood pressure log with you for your follow up appointment.  DASH/Mediterranean Diets are healthier choices for HTN.    Dyslipidemia, goal LDL below 100 INSTRUCTIONS: Work on a low fat, heart healthy diet and participate in regular aerobic exercise program by working out at least 150 minutes per week; 5 days a week-30 minutes per day. Avoid red meat/beef/steak,  fried foods. junk foods, sodas, sugary drinks, unhealthy snacking, alcohol and smoking.  Drink at least 80 oz of water per day and monitor your carbohydrate intake daily.    Colon cancer screening -     Fecal occult blood, imunochemical([facility]); Future     Follow Up Instructions Return in about 3 months (around 06/14/2020).     I discussed the assessment and treatment plan with the patient. The patient was provided an opportunity to ask questions and all were answered. The patient agreed with the plan and demonstrated an understanding of the instructions.   The patient was advised to call back or seek an  in-person evaluation if the symptoms worsen or if the condition fails to improve as anticipated.  I provided 20 minutes of non-face-to-face time during this encounter including median intraservice time, reviewing previous notes, labs, imaging, medications and explaining diagnosis and management.  Gildardo Pounds, FNP-BC

## 2020-03-15 MED FILL — HYDROCHLOROTHIAZIDE 25 MG T: 25 | 30 days supply | Qty: 30 | Fill #1

## 2020-03-15 MED FILL — ATORVASTATIN CALCIUM 20 MG: 20 | 30 days supply | Qty: 30 | Fill #1

## 2020-03-15 MED FILL — AMLODIPINE BESYLATE 5 MG TA: 5 | 30 days supply | Qty: 30 | Fill #1

## 2020-03-20 MED FILL — OMEPRAZOLE 20 MG CAP: 20 | 30 days supply | Qty: 30 | Fill #0

## 2020-03-24 NOTE — Addendum Note (Signed)
Addended bySteffanie Dunn on: 03/24/2020 04:33 PM   Modules accepted: Orders

## 2020-03-25 LAB — FECAL OCCULT BLOOD, IMMUNOCHEMICAL: Fecal Occult Bld: NEGATIVE

## 2020-04-24 ENCOUNTER — Other Ambulatory Visit: Payer: Self-pay

## 2020-04-24 MED FILL — Hydrochlorothiazide Tab 25 MG: ORAL | 30 days supply | Qty: 30 | Fill #0 | Status: AC

## 2020-04-24 MED FILL — Amlodipine Besylate Tab 5 MG (Base Equivalent): ORAL | 30 days supply | Qty: 30 | Fill #0 | Status: AC

## 2020-04-24 MED FILL — Atorvastatin Calcium Tab 20 MG (Base Equivalent): ORAL | 30 days supply | Qty: 30 | Fill #0 | Status: AC

## 2020-05-30 ENCOUNTER — Other Ambulatory Visit: Payer: Self-pay

## 2020-05-30 ENCOUNTER — Other Ambulatory Visit: Payer: Self-pay | Admitting: Nurse Practitioner

## 2020-05-30 MED ORDER — AMLODIPINE BESYLATE 5 MG PO TABS
ORAL_TABLET | Freq: Every day | ORAL | 0 refills | Status: DC
  Filled 2020-05-30: qty 90, 90d supply, fill #0

## 2020-05-30 MED FILL — Atorvastatin Calcium Tab 20 MG (Base Equivalent): ORAL | 90 days supply | Qty: 90 | Fill #1 | Status: AC

## 2020-05-30 MED FILL — Hydrochlorothiazide Tab 25 MG: ORAL | 90 days supply | Qty: 90 | Fill #1 | Status: AC

## 2020-06-01 ENCOUNTER — Other Ambulatory Visit: Payer: Self-pay

## 2020-09-08 ENCOUNTER — Other Ambulatory Visit: Payer: Self-pay

## 2020-09-08 ENCOUNTER — Other Ambulatory Visit: Payer: Self-pay | Admitting: Nurse Practitioner

## 2020-09-08 MED ORDER — AMLODIPINE BESYLATE 5 MG PO TABS
ORAL_TABLET | Freq: Every day | ORAL | 0 refills | Status: DC
  Filled 2020-09-08: qty 90, 90d supply, fill #0

## 2020-09-08 MED ORDER — HYDROCHLOROTHIAZIDE 25 MG PO TABS
ORAL_TABLET | Freq: Every day | ORAL | 0 refills | Status: DC
  Filled 2020-09-08: qty 90, 90d supply, fill #0

## 2020-09-08 MED FILL — Atorvastatin Calcium Tab 20 MG (Base Equivalent): ORAL | 90 days supply | Qty: 90 | Fill #2 | Status: AC

## 2020-09-21 ENCOUNTER — Other Ambulatory Visit: Payer: Self-pay

## 2020-09-21 MED FILL — Hydroxyzine HCl Tab 25 MG: ORAL | 60 days supply | Qty: 120 | Fill #0 | Status: AC

## 2020-09-22 ENCOUNTER — Other Ambulatory Visit: Payer: Self-pay

## 2020-12-13 ENCOUNTER — Telehealth: Payer: Self-pay | Admitting: Nurse Practitioner

## 2020-12-13 ENCOUNTER — Other Ambulatory Visit: Payer: Self-pay

## 2020-12-13 MED FILL — Atorvastatin Calcium Tab 20 MG (Base Equivalent): ORAL | 90 days supply | Qty: 90 | Fill #3 | Status: AC

## 2020-12-14 NOTE — Telephone Encounter (Signed)
Requested medications are due for refill today.  yes  Requested medications are on the active medications list.  yes  Last refill. 09/08/2020 for both  Future visit scheduled.   no  Notes to clinic.  Pt is more than 3 months overdue for office visit.

## 2020-12-15 ENCOUNTER — Other Ambulatory Visit: Payer: Self-pay | Admitting: Nurse Practitioner

## 2020-12-15 ENCOUNTER — Other Ambulatory Visit: Payer: Self-pay

## 2020-12-15 MED ORDER — HYDROCHLOROTHIAZIDE 25 MG PO TABS
ORAL_TABLET | Freq: Every day | ORAL | 0 refills | Status: AC
  Filled 2020-12-15: qty 30, 30d supply, fill #0

## 2020-12-15 MED ORDER — AMLODIPINE BESYLATE 5 MG PO TABS
ORAL_TABLET | Freq: Every day | ORAL | 0 refills | Status: AC
  Filled 2020-12-15: qty 30, 30d supply, fill #0

## 2020-12-15 NOTE — Telephone Encounter (Signed)
Refills sent

## 2020-12-15 NOTE — Telephone Encounter (Signed)
Pt scheduled future appt, please advise if courtesy refill can be given.

## 2020-12-15 NOTE — Telephone Encounter (Signed)
Called pt unable to reach or leave VM

## 2020-12-18 ENCOUNTER — Other Ambulatory Visit: Payer: Self-pay

## 2020-12-20 ENCOUNTER — Other Ambulatory Visit: Payer: Self-pay

## 2021-01-24 ENCOUNTER — Other Ambulatory Visit: Payer: Self-pay

## 2021-01-24 ENCOUNTER — Ambulatory Visit: Payer: Self-pay | Admitting: Physician Assistant

## 2021-01-24 NOTE — Progress Notes (Deleted)
Patient ID: Tammy Beck, female   DOB: 1955/11/21, 67 y.o.   MRN: [id number] Virtual Visit via Telephone Note  I connected with [name] on 01/24/21 at  9:30 AM EST by telephone and verified that I am speaking with the correct person using two identifiers.  Location: Patient: *** Provider: ***   I discussed the limitations, risks, security and privacy concerns of performing an evaluation and management service by telephone and the availability of in person appointments. I also discussed with the patient that there may be a patient responsible charge related to this service. The patient expressed understanding and agreed to proceed.   History of Present Illness:    Observations/Objective:   Assessment and Plan:   Follow Up Instructions:    I discussed the assessment and treatment plan with the patient. The patient was provided an opportunity to ask questions and all were answered. The patient agreed with the plan and demonstrated an understanding of the instructions.   The patient was advised to call back or seek an in-person evaluation if the symptoms worsen or if the condition fails to improve as anticipated.  I provided *** minutes of non-face-to-face time during this encounter.   Freeman Caldron, PA-C

## 2021-01-25 ENCOUNTER — Telehealth: Payer: Self-pay | Admitting: Nurse Practitioner

## 2021-01-25 NOTE — Telephone Encounter (Signed)
Copied from [place] [id number]. Topic: General - Other >> Jan 24, 2021  4:23 PM Alanda Slim E wrote: Reason for CRM:  FYI/ pt will no longer use this office as her PCP and has moved out of the area  [facility]

## 2022-09-20 NOTE — Progress Notes (Signed)
This encounter was created in error - please disregard.
